# Patient Record
Sex: Male | Born: 1985 | Hispanic: Yes | Smoking: Never smoker
Health system: Southern US, Community
[De-identification: ages and names within clinical notes are randomized; demographics above are authoritative.]

---
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
## 2017-04-03 ENCOUNTER — Emergency Department
Admission: EM | Admit: 2017-04-03 | Discharge: 2017-04-03 | Disposition: A | Discharge status: Home / Self Care | Payer: Self-pay | Attending: Student in an Organized Health Care Education/Training Program | Admitting: Student in an Organized Health Care Education/Training Program

## 2017-04-03 ENCOUNTER — Emergency Department: Payer: Self-pay

## 2017-04-03 ENCOUNTER — Encounter: Payer: Self-pay | Admitting: *Deleted

## 2017-04-03 ENCOUNTER — Other Ambulatory Visit: Payer: Self-pay

## 2017-04-03 DIAGNOSIS — N50812 Left testicular pain: Secondary | ICD-10-CM | POA: Insufficient documentation

## 2017-04-03 DIAGNOSIS — F17210 Nicotine dependence, cigarettes, uncomplicated: Secondary | ICD-10-CM | POA: Insufficient documentation

## 2017-04-03 LAB — URINALYSIS, COMPLETE (UACMP) WITH MICROSCOPIC
BILIRUBIN URINE: NEGATIVE
Bacteria, UA: NONE SEEN
GLUCOSE, UA: NEGATIVE mg/dL
HGB URINE DIPSTICK: NEGATIVE
Ketones, ur: NEGATIVE mg/dL
LEUKOCYTES UA: NEGATIVE
NITRITE: NEGATIVE
PROTEIN: NEGATIVE mg/dL
Specific Gravity, Urine: 1.017 (ref 1.005–1.030)
Squamous Epithelial / LPF: NONE SEEN
pH: 8 (ref 5.0–8.0)

## 2017-04-03 LAB — CBC WITH DIFFERENTIAL/PLATELET
BASOS ABS: 0.1 10*3/uL (ref 0–0.1)
Basophils Relative: 1 %
Eosinophils Absolute: 0.1 10*3/uL (ref 0–0.7)
Eosinophils Relative: 1 %
HEMATOCRIT: 39.9 % — AB (ref 40.0–52.0)
HEMOGLOBIN: 13.5 g/dL (ref 13.0–18.0)
LYMPHS PCT: 26 %
Lymphs Abs: 3.3 10*3/uL (ref 1.0–3.6)
MCH: 29.9 pg (ref 26.0–34.0)
MCHC: 33.9 g/dL (ref 32.0–36.0)
MCV: 88.3 fL (ref 80.0–100.0)
MONO ABS: 0.9 10*3/uL (ref 0.2–1.0)
Monocytes Relative: 7 %
NEUTROS ABS: 8.1 10*3/uL — AB (ref 1.4–6.5)
NEUTROS PCT: 65 %
Platelets: 213 10*3/uL (ref 150–440)
RBC: 4.52 MIL/uL (ref 4.40–5.90)
RDW: 13.7 % (ref 11.5–14.5)
WBC: 12.5 10*3/uL — ABNORMAL HIGH (ref 3.8–10.6)

## 2017-04-03 LAB — BASIC METABOLIC PANEL
ANION GAP: 8 (ref 5–15)
BUN: 15 mg/dL (ref 6–20)
CHLORIDE: 101 mmol/L (ref 101–111)
CO2: 26 mmol/L (ref 22–32)
Calcium: 9.1 mg/dL (ref 8.9–10.3)
Creatinine, Ser: 1.09 mg/dL (ref 0.61–1.24)
GFR calc non Af Amer: >60 mL/min (ref >60)
GLUCOSE: 97 mg/dL (ref 65–99)
POTASSIUM: 3.8 mmol/L (ref 3.5–5.1)
Sodium: 135 mmol/L (ref 135–145)

## 2017-04-03 MED ORDER — MORPHINE SULFATE (PF) 4 MG/ML IV SOLN
4.0000 mg | INTRAVENOUS | Status: DC | PRN
  Administered 2017-04-03: 4 mg via INTRAVENOUS
  Filled 2017-04-03 (×2): qty 1

## 2017-04-03 MED ORDER — TRAMADOL HCL 50 MG PO TABS: 50.0000 mg | ORAL_TABLET | Freq: Four times a day (QID) | ORAL | 0 refills | Status: DC | PRN

## 2017-04-03 MED ORDER — CEFTRIAXONE SODIUM 250 MG IJ SOLR
INTRAMUSCULAR | Status: AC
Start: 2017-04-03 — End: 2017-04-03
  Administered 2017-04-03: 250 mg via INTRAMUSCULAR
  Filled 2017-04-03: qty 250

## 2017-04-03 MED ORDER — AZITHROMYCIN 500 MG PO TABS
1000.0000 mg | ORAL_TABLET | Freq: Once | ORAL | Status: AC
  Administered 2017-04-03: 1000 mg via ORAL

## 2017-04-03 MED ORDER — PROMETHAZINE HCL 25 MG/ML IJ SOLN
12.5000 mg | Freq: Four times a day (QID) | INTRAMUSCULAR | Status: DC | PRN
  Filled 2017-04-03: qty 1

## 2017-04-03 MED ORDER — NAPROXEN 500 MG PO TABS: 500.0000 mg | ORAL_TABLET | Freq: Two times a day (BID) | ORAL | 0 refills | Status: DC

## 2017-04-03 MED ORDER — KETOROLAC TROMETHAMINE 30 MG/ML IJ SOLN
INTRAMUSCULAR | Status: AC
  Filled 2017-04-03: qty 1

## 2017-04-03 MED ORDER — MORPHINE SULFATE (PF) 4 MG/ML IV SOLN
6.0000 mg | INTRAVENOUS | Status: DC | PRN
Start: 2017-04-03 — End: 2017-04-04
  Administered 2017-04-03: 6 mg via INTRAMUSCULAR

## 2017-04-03 MED ORDER — MORPHINE SULFATE (PF) 4 MG/ML IV SOLN
INTRAVENOUS | Status: AC
  Administered 2017-04-03: 6 mg via INTRAMUSCULAR
  Filled 2017-04-03: qty 2

## 2017-04-03 MED ORDER — DEXTROSE 5 % IV SOLN: 250.0000 mg | Freq: Once | INTRAVENOUS | Status: DC

## 2017-04-03 MED ORDER — AZITHROMYCIN 500 MG PO TABS
ORAL_TABLET | ORAL | Status: AC
Start: 2017-04-03 — End: 2017-04-03
  Administered 2017-04-03: 1000 mg via ORAL
  Filled 2017-04-03: qty 2

## 2017-04-03 MED ORDER — CEFTRIAXONE SODIUM 250 MG IJ SOLR
250.0000 mg | Freq: Once | INTRAMUSCULAR | Status: AC
  Administered 2017-04-03: 250 mg via INTRAMUSCULAR

## 2017-04-03 MED ORDER — KETOROLAC TROMETHAMINE 30 MG/ML IJ SOLN
15.0000 mg | Freq: Once | INTRAMUSCULAR | Status: AC
  Administered 2017-04-03: 15 mg via INTRAVENOUS

## 2017-04-03 MED ORDER — DOXYCYCLINE HYCLATE 50 MG PO CAPS: 100.0000 mg | ORAL_CAPSULE | Freq: Two times a day (BID) | ORAL | 0 refills | Status: AC

## 2017-04-03 NOTE — ED Provider Notes (Signed)
[facility] Emergency Department Provider Note    First MD Initiated Contact with Patient 04/03/17 1710     (approximate)  I have reviewed the triage vital signs and the nursing notes.   HISTORY  Chief Complaint Testicle Pain    HPI Luis Bryant is a 31 y.o. male presents with less than 24 hours of progressively worsening severe aching left testicular pain feels also noted some swelling in the left testicle.  Pain is also shooting down into his left groin.  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  Denies any fevers.  No flank pain.  Is never had pain like this before.  Denies any dysuria.  No history of STI.  History reviewed. No pertinent past medical history. History reviewed. No pertinent family history. History reviewed. No pertinent surgical history. There are no active problems to display for this patient.     Prior to Admission medications   Not on File    Allergies Patient has no allergy information on record.    Social History Social History   Tobacco Use  . Smoking status: Current Every Day Smoker    Packs/day: 1.00    Types: Cigarettes  . Smokeless tobacco: Never Used  Substance Use Topics  . Alcohol use: Yes    Frequency: Never    Comment: occasionally.   . Drug use: No    Review of Systems Patient denies headaches, rhinorrhea, blurry vision, numbness, shortness of breath, chest pain, edema, cough, abdominal pain, nausea, vomiting, diarrhea, dysuria, fevers, rashes or hallucinations unless otherwise stated above in HPI. ____________________________________________   PHYSICAL EXAM:  VITAL SIGNS: Vitals:   04/03/17 1650  BP: (!) 149/75  Pulse: 82  Resp: 16  Temp: 97.6 F (36.4 C)  SpO2: 100%    Constitutional: Alert and oriented.  Very uncomfortable appearing but in no acute respiratory distress  eyes:  Conjunctivae are normal.  Head: Atraumatic. Nose: No congestion/rhinnorhea. Mouth/Throat: Mucous membranes are moist.   Neck: No stridor. Painless ROM.  Cardiovascular: Normal rate, regular rhythm. Grossly normal heart sounds.  Good peripheral circulation. Respiratory: Normal respiratory effort.  No retractions. Lungs CTAB. Gastrointestinal: Soft and nontender. No distention. No abdominal bruits. No CVA tenderness. Genitourinary: Normal external genitalia with her left testicle is tender and with no masses.  Cremasteric reflexes present bilaterally Musculoskeletal: No lower extremity tenderness nor edema.  No joint effusions. Neurologic:  Normal speech and language. No gross focal neurologic deficits are appreciated. No facial droop Skin:  Skin is warm, dry and intact. No rash noted. Psychiatric: Mood and affect are normal. Speech and behavior are normal.  ____________________________________________   LABS (all labs ordered are listed, but only abnormal results are displayed)  No results found for this or any previous visit (from the past 24 hour(s)). ____________________________________________ ____________________________________________  RADIOLOGY  I personally reviewed all radiographic images ordered to evaluate for the above acute complaints and reviewed radiology reports and findings.  These findings were personally discussed with the patient.  Please see medical record for radiology report.  ____________________________________________   PROCEDURES  Procedure(s) performed:  Procedures    Critical Care performed: no ____________________________________________   INITIAL IMPRESSION / ASSESSMENT AND PLAN / ED COURSE  Pertinent labs & imaging results that were available during my care of the patient were reviewed by me and considered in my medical decision making (see chart for details).  DDX: Torsion,  orchitis, UTI, hernia, colitis, stone, epididymitis, urinary  tract infection, muscular skeletal strain  Luis Bryant is a 31 y.o. who presents to the ED with symptoms as described above.  Initial presentation was concerning for torsion therefore emergent ultrasound ordered.  Ultrasound showed good bilateral blood flow and his exam was showed bilateral cremasteric reflex with pain isolated more prominently over the left epididymis but the patient was in significant discomfort therefore given IV pain medications.  No clinical evidence of hernia.  His abdominal exam is other soft and benign.  Based on the severity of his pain with a normal ultrasound and CT imaging of his abdomen and pelvis was ordered to evaluate for renal stone.  No evidence of stone or hydronephrosis.  Is not clinically consistent with appendicitis.  Urinalysis is otherwise normal.  Based on the patient's persistent pain and location of the pain will treat with antibiotics given his mild white count due to concern for epididymitis.  Patient denies any dysuria and clinically seems less consistent with UTI but as he is at higher risk given his age will cover with abx.  Patient was able to tolerate PO and was able to ambulate with a steady gait.  Have discussed with the patient and available family all diagnostics and treatments performed thus far and all questions were answered to the best of my ability. The patient demonstrates understanding and agreement with plan.       ____________________________________________   FINAL CLINICAL IMPRESSION(S) / ED DIAGNOSES  Final diagnoses:  Testicular pain, left      NEW MEDICATIONS STARTED DURING THIS VISIT:  This SmartLink is deprecated. Use AVSMEDLIST instead to display the medication list for a patient.   Note:  This document was prepared using Dragon voice recognition software and may include unintentional dictation errors.    Willy Eddyobinson, Jonavan Vanhorn, MD 04/03/17 2209

## 2017-04-03 NOTE — ED Notes (Signed)
Pt refuses any more pain medication, states, "I just dont like to take a lot of medication, I will let you know if I need anything."  EDP aware.

## 2017-04-03 NOTE — ED Notes (Signed)
MD in to speak with patient regarding discharge. Patient is crying in pain from the IM rocephin shot stating that it is "very painful."  Verbal order for morphine before patient can be discharged.

## 2017-04-03 NOTE — ED Notes (Signed)
Patient is refusing pain medication at this time

## 2017-04-03 NOTE — ED Triage Notes (Signed)
Pt to ED reporting new onset left testicular pain that started as an ache last night and has become severe pain today. No changes color reported but pt reports noticing swelling to the left testicle. Pt reports pain is radiating down left leg.

## 2017-04-03 NOTE — Discharge Instructions (Signed)

## 2017-04-03 NOTE — ED Notes (Signed)
Patient states pain is now better and he is able to go home. Discharge instructions reviewed as well as prescriptions. He verbalizes understanding.

## 2017-04-03 NOTE — ED Notes (Signed)
Patient transported to CT 

## 2017-04-05 ENCOUNTER — Other Ambulatory Visit: Payer: Self-pay

## 2017-04-05 ENCOUNTER — Emergency Department: Payer: Self-pay

## 2017-04-05 ENCOUNTER — Encounter: Payer: Self-pay | Admitting: Emergency Medicine

## 2017-04-05 ENCOUNTER — Emergency Department
Admission: EM | Admit: 2017-04-05 | Discharge: 2017-04-05 | Disposition: A | Discharge status: Home / Self Care | Payer: Self-pay | Attending: Emergency Medicine | Admitting: Emergency Medicine

## 2017-04-05 DIAGNOSIS — N451 Epididymitis: Secondary | ICD-10-CM | POA: Insufficient documentation

## 2017-04-05 DIAGNOSIS — F17210 Nicotine dependence, cigarettes, uncomplicated: Secondary | ICD-10-CM | POA: Insufficient documentation

## 2017-04-05 DIAGNOSIS — N50812 Left testicular pain: Secondary | ICD-10-CM | POA: Insufficient documentation

## 2017-04-05 DIAGNOSIS — N50819 Testicular pain, unspecified: Secondary | ICD-10-CM

## 2017-04-05 LAB — CBC WITH DIFFERENTIAL/PLATELET
BASOS ABS: 0.1 10*3/uL (ref 0–0.1)
BASOS PCT: 1 %
EOS ABS: 0.3 10*3/uL (ref 0–0.7)
Eosinophils Relative: 4 %
HCT: 43.1 % (ref 40.0–52.0)
Hemoglobin: 14.5 g/dL (ref 13.0–18.0)
LYMPHS ABS: 2.7 10*3/uL (ref 1.0–3.6)
Lymphocytes Relative: 33 %
MCH: 30.2 pg (ref 26.0–34.0)
MCHC: 33.7 g/dL (ref 32.0–36.0)
MCV: 89.7 fL (ref 80.0–100.0)
MONOS PCT: 9 %
Monocytes Absolute: 0.7 10*3/uL (ref 0.2–1.0)
NEUTROS ABS: 4.3 10*3/uL (ref 1.4–6.5)
NEUTROS PCT: 53 %
PLATELETS: 238 10*3/uL (ref 150–440)
RBC: 4.81 MIL/uL (ref 4.40–5.90)
RDW: 13.8 % (ref 11.5–14.5)
WBC: 8.2 10*3/uL (ref 3.8–10.6)

## 2017-04-05 LAB — URINALYSIS, COMPLETE (UACMP) WITH MICROSCOPIC
BILIRUBIN URINE: NEGATIVE
Bacteria, UA: NONE SEEN
GLUCOSE, UA: NEGATIVE mg/dL
KETONES UR: NEGATIVE mg/dL
LEUKOCYTES UA: NEGATIVE
NITRITE: NEGATIVE
PH: 5 (ref 5.0–8.0)
Protein, ur: NEGATIVE mg/dL
SPECIFIC GRAVITY, URINE: 1.023 (ref 1.005–1.030)
SQUAMOUS EPITHELIAL / LPF: NONE SEEN

## 2017-04-05 LAB — BASIC METABOLIC PANEL
ANION GAP: 12 (ref 5–15)
BUN: 22 mg/dL — ABNORMAL HIGH (ref 6–20)
CALCIUM: 9.4 mg/dL (ref 8.9–10.3)
CO2: 23 mmol/L (ref 22–32)
Chloride: 105 mmol/L (ref 101–111)
Creatinine, Ser: 1.03 mg/dL (ref 0.61–1.24)
Glucose, Bld: 113 mg/dL — ABNORMAL HIGH (ref 65–99)
POTASSIUM: 4.4 mmol/L (ref 3.5–5.1)
SODIUM: 140 mmol/L (ref 135–145)

## 2017-04-05 MED ORDER — TRAMADOL HCL 50 MG PO TABS
50.0000 mg | ORAL_TABLET | Freq: Once | ORAL | Status: AC
  Administered 2017-04-05: 50 mg via ORAL
  Filled 2017-04-05: qty 1

## 2017-04-05 MED ORDER — TRAMADOL HCL 50 MG PO TABS: 50.0000 mg | ORAL_TABLET | Freq: Four times a day (QID) | ORAL | 0 refills | Status: DC | PRN

## 2017-04-05 NOTE — Discharge Instructions (Signed)
Please seek medical attention for any high fevers, chest pain, shortness of breath, change in behavior, persistent vomiting, bloody stool or any other new or concerning symptoms.  

## 2017-04-05 NOTE — ED Provider Notes (Signed)
[facility] Emergency Department Provider Note   ____________________________________________   I have reviewed the triage vital signs and the nursing notes.   HISTORY  Chief Complaint Testicle Pain   History limited by: Not Limited   HPI Luis Bryant is a 31 y.o. male who presents to the emergency department today with continued left testicular pain.   LOCATION:left testicle DURATION:3 days TIMING: constant SEVERITY: severe QUALITY: aching CONTEXT: patient was seen in the emergency department two days ago. Had negative ct and us. Was treated empirically for epididymitis. States he did not get prescription for tramadol. MODIFYING FACTORS: worse with movement ASSOCIATED SYMPTOMS: no fever  Per medical record review patient has a history of recent er visit. Per  drug database no tramadol prescriptions filled.  History reviewed. No pertinent past medical history.  There are no active problems to display for this patient.   History reviewed. No pertinent surgical history.  Prior to Admission medications   Medication Sig Start Date End Date Taking? Authorizing Provider  doxycycline (VIBRAMYCIN) 50 MG capsule Take 2 capsules (100 mg total) by mouth 2 (two) times daily for 7 days. 04/03/17 04/10/17  Willy Eddyobinson, Patrick, MD  naproxen (NAPROSYN) 500 MG tablet Take 1 tablet (500 mg total) by mouth 2 (two) times daily with a meal. 04/03/17 04/03/18  Willy Eddyobinson, Patrick, MD  traMADol (ULTRAM) 50 MG tablet Take 1 tablet (50 mg total) by mouth every 6 (six) hours as needed. 04/03/17 04/03/18  Willy Eddyobinson, Patrick, MD    Allergies Patient has no allergy information on record.  History reviewed. No pertinent family history.  Social History Social History   Tobacco Use  . Smoking status: Current Every Day Smoker    Packs/day: 1.00    Types: Cigarettes  . Smokeless tobacco: Never Used  Substance Use Topics  . Alcohol use: Yes    Frequency: Never   Comment: occasionally.   . Drug use: No    Review of Systems Constitutional: No fever/chills Eyes: No visual changes. ENT: No sore throat. Cardiovascular: Denies chest pain. Respiratory: Denies shortness of breath. Gastrointestinal: No abdominal pain.  No nausea, no vomiting.  No diarrhea.   Genitourinary: Positive for testicular pain. Musculoskeletal: Negative for back pain. Skin: Negative for rash. Neurological: Negative for headaches, focal weakness or numbness.  ____________________________________________   PHYSICAL EXAM:  VITAL SIGNS: ED Triage Vitals  Enc Vitals Group     BP 04/05/17 1847 124/73     Pulse Rate 04/05/17 1847 96     Resp 04/05/17 1847 17     Temp 04/05/17 1847 98.3 F (36.8 C)     Temp Source 04/05/17 1847 Oral     SpO2 04/05/17 1847 98 %     Weight 04/05/17 1847 160 lb (72.6 kg)     Height 04/05/17 1847 5\' 8"  (1.727 m)     Head Circumference --      Peak Flow --      Pain Score 04/05/17 1846 9   Constitutional: Alert and oriented. Well appearing and in no distress. Eyes: Conjunctivae are normal.  ENT   Head: Normocephalic and atraumatic.   Nose: No congestion/rhinnorhea.   Mouth/Throat: Mucous membranes are moist.   Neck: No stridor. Hematological/Lymphatic/Immunilogical: No cervical lymphadenopathy. Cardiovascular: Normal rate, regular rhythm.  No murmurs, rubs, or gallops.  Respiratory: Normal respiratory effort without tachypnea nor retractions. Breath sounds are clear and equal bilaterally. No wheezes/rales/rhonchi. Gastrointestinal: Soft and non tender. No rebound. No guarding.  Genitourinary: Left testicle swollen, tender.  Musculoskeletal: Normal range of motion in all extremities. No lower extremity edema. Neurologic:  Normal speech and language. No gross focal neurologic deficits are appreciated.  Skin:  Skin is warm, dry and intact. No rash noted. Psychiatric: Mood and affect are normal. Speech and behavior are normal.  Patient exhibits appropriate insight and judgment.  ____________________________________________    LABS (pertinent positives/negatives)  Cbc wnl BMP glu 113, bun 22 UA not consistent with infection  ____________________________________________   EKG  None  ____________________________________________    RADIOLOGY  US Left epididymitis. Likely complex epididymal cyst.  ____________________________________________   PROCEDURES  Procedures  ____________________________________________   INITIAL IMPRESSION / ASSESSMENT AND PLAN / ED COURSE  Pertinent labs & imaging results that were available during my care of the patient were reviewed by me and considered in my medical decision making (see chart for details).  Patient presents to the emergency department today because of concerns for continued left testicular pain.  Was seen 2 days ago with a negative workup.  Ultrasound today does show epididymitis.  Patient states he did not get a prescription for his tramadol.  [facility] I do not see any tramadol prescriptions filled.  He states he has been taking his antibiotics.  Will give patient prescription for tramadol.  Discussed results with patient.  Discussed importance of follow-up with urology.  This point I do not feel that the complex cystic lesion represents an abscess given improving white blood cell count.  However urology follow-up should be able to follow him for this.   ____________________________________________   FINAL CLINICAL IMPRESSION(S) / ED DIAGNOSES  Final diagnoses:  Testicle pain  Epididymitis     Note: This dictation was prepared with Dragon dictation. Any transcriptional errors that result from this process are unintentional     Phineas SemenGoodman, Anuj Summons, MD 04/05/17 2041

## 2017-04-05 NOTE — ED Notes (Signed)
Pt ambulating in room without difficulty; pt calm with no distress, drinking bottle of what appears to be soda; voices understanding of d/c instructions and PO med admin as ordered

## 2017-04-05 NOTE — ED Triage Notes (Addendum)
Pt here for left testicle pain. No fevers. Here 2 days ago for same. Pain not any better.  Has started abx from yesterday.  No change in testicle. Still swollen same as yesterday but not worse.  Ambulatory. VSS. Reports was not given pain medication only abx but showing script for tramadol/naproxen ordered.

## 2017-04-05 NOTE — ED Notes (Signed)
SO returns to desk again upset that no one is going to bring him any pain medication and they cannot get prescription filled in time tonight; again explained to SO that as I stated before I will be bringing him a dose of pain medication to take now

## 2017-04-05 NOTE — ED Notes (Signed)
Pt uprite on stretcher in exam room watching TV with no distress noted, eating rice krispie bar; pt reports seen 2 days ago and rx doxycycline for left testicular pain/swelling; pt c/o persistent symptoms; also reports that he was not given any meds for pain; denies any urinary c/o or other accomp symptoms; pt instructed not to eat or drink until u/s results received and examined by ED provider; pt voices understanding

## 2017-04-05 NOTE — ED Notes (Signed)
SO returns to nurses desk, upset that "no one has brought him any ice water"; explained to SO that I will be bringing in his water, pain medication and d/c instructions together

## 2017-04-05 NOTE — ED Notes (Signed)
SO to nurses station, upset that pt "has not been told anything and hasn't received anything for pain"; clarified with pt if MD had spoken with pt about his results and SO st yes but "no one has told him what is happening"; instructed SO that pt may go ahead and get dressed and I will bring in his d/c instructions with his prescription and pain med

## 2017-04-10 NOTE — Progress Notes (Signed)
04/11/2017 3:38 PM   Luis Bryant 01/10/1986 [id number]  Referring provider: No referring provider defined for this encounter.  No chief complaint on file.   HPI: Patient is a 31 year old Caucasian male who is [facility] ED for epididymitis with his wife, Shanda BumpsJessica.     He states one to two weeks ago he started to have left testicular swelling and pain.   He did not have fevers, chills, nausea or vomiting.  He has no urinary symptoms associated with the pain.  He is not having difficulty with erections or pain.  He is not having penile discharge.  He is not having pain with ejaculations.  He has been out of work for the last few days due to the pain.  He has been given azithromycin, Rocephin and is currently on doxycycline.  His UA was negative.    Scrotal ultrasound performed on 04/03/2017 noted no intratesticular mass or torsion.  CT Renal stone study from the same date noted no renal calculi or obstructive changes are noted.  Changes of prior granulomatous disease.  Scrotal ultrasound performed on 04/05/2017 noted heterogeneous and hypervascular left epididymis, suggesting possible acute epididymitis.  Superimposed 6 mm complex cystic lesion at the left epididymal tail. Finding favored to reflect a superimposed incidental complex epididymal cyst, although a small phlegmon and/or early abscess could also be considered.  Otherwise negative testicular ultrasound. No evidence for torsion.  Reviewed referral notes.                         - ED visit on 04/03/2017 Luis BowRoderick D Lengacher is a 31 y.o. male presents with less than 24 hours of progressively worsening severe aching left testicular pain feels also noted some swelling in the left testicle.  Pain is also shooting down into his left groin.  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  Denies any fevers.  No flank pain.  Is never had  pain like this before.  Denies any dysuria.  No history of STI.                    - ED visit on 04/05/2017 Patient presents to the emergency department today because of concerns for continued left testicular pain.  Was seen 2 days ago with a negative workup.  Ultrasound today does show epididymitis.  Patient states he did not get a prescription for his tramadol.  [facility] I do not see any tramadol prescriptions filled.  He states he has been taking his antibiotics.  Will give patient prescription for tramadol.  Discussed results with patient.  Discussed importance of follow-up with urology.  This point I do not feel that the complex cystic lesion represents an abscess given improving white blood cell count.  However urology follow-up should be able to follow him for this.   PMH: No past medical history on file.  Surgical History: No past surgical history on file.  Home Medications:  Allergies as of 04/11/2017   No Known Allergies     Medication List        Accurate as of 04/11/17  3:38 PM. Always use your most recent med list.          doxycycline 100 MG EC tablet Commonly known as:  DORYX Take 100 mg by mouth 2 (two) times daily.  doxycycline 100 MG capsule Commonly known as:  VIBRAMYCIN Take 1 capsule (100 mg total) by mouth every 12 (twelve) hours.   naproxen 500 MG tablet Commonly known as:  NAPROSYN Take 1 tablet (500 mg total) by mouth 2 (two) times daily with a meal.   traMADol 50 MG tablet Commonly known as:  ULTRAM Take 1 tablet (50 mg total) by mouth every 6 (six) hours as needed.       Allergies: No Known Allergies  Family History: Family History  Problem Relation Age of Onset  . Prostate cancer Neg Hx   . Bladder Cancer Neg Hx   . Kidney cancer Neg Hx     Social History:  reports that he has been smoking cigarettes.  He has been smoking about 1.00 pack per day. he has never used smokeless tobacco. He reports that he drinks  alcohol. He reports that he does not use drugs.  ROS: UROLOGY Frequent Urination?: No Hard to postpone urination?: No Burning/pain with urination?: No Get up at night to urinate?: No Leakage of urine?: No Urine stream starts and stops?: No Trouble starting stream?: No Do you have to strain to urinate?: No Blood in urine?: No Urinary tract infection?: No Sexually transmitted disease?: No Injury to kidneys or bladder?: No Painful intercourse?: No Weak stream?: No Erection problems?: No Penile pain?: No  Gastrointestinal Nausea?: No Vomiting?: No Indigestion/heartburn?: No Diarrhea?: No Constipation?: No  Constitutional Fever: No Night sweats?: No Weight loss?: No Fatigue?: No  Skin Skin rash/lesions?: No Itching?: No  Eyes Blurred vision?: No Double vision?: No  Ears/Nose/Throat Sore throat?: No Sinus problems?: No  Hematologic/Lymphatic Swollen glands?: No Easy bruising?: No  Cardiovascular Leg swelling?: No Chest pain?: No  Respiratory Cough?: No Shortness of breath?: No  Endocrine Excessive thirst?: No  Musculoskeletal Back pain?: No Joint pain?: No  Neurological Headaches?: No Dizziness?: No  Psychologic Depression?: No Anxiety?: No  Physical Exam: BP 120/77 (BP Location: Right Arm, Patient Position: Sitting, Cuff Size: Normal)   Pulse 91   Ht 5\' 8"  (1.727 m)   Wt 155 lb 12.8 oz (70.7 kg)   BMI 23.69 kg/m   Constitutional: Well nourished. Alert and oriented, No acute distress. HEENT: [place] AT, moist mucus membranes. Trachea midline, no masses. Cardiovascular: No clubbing, cyanosis, or edema. Respiratory: Normal respiratory effort, no increased work of breathing. GI: Abdomen is soft, non tender, non distended, no abdominal masses. Liver and spleen not palpable.  No hernias appreciated.  Stool sample for occult testing is not indicated.   GU: No CVA tenderness.  No bladder fullness or masses.  Patient with uncircumcised phallus.   Foreskin easily retracted.  Urethral meatus is patent.  No penile discharge. No penile lesions or rashes. Scrotum without lesions, cysts, rashes and/or edema.  Testicles are located scrotally bilaterally. No masses are appreciated in the testicles. Right epididymis is normal.  Left epididymis is tender and indurated.   Rectal: Deferred.  Skin: No rashes, bruises or suspicious lesions. Lymph: No cervical or inguinal adenopathy. Neurologic: Grossly intact, no focal deficits, moving all 4 extremities. Psychiatric: Normal mood and affect.  Laboratory Data: Lab Results  Component Value Date   WBC 8.2 04/05/2017   HGB 14.5 04/05/2017   HCT 43.1 04/05/2017   MCV 89.7 04/05/2017   PLT 238 04/05/2017    Lab Results  Component Value Date   CREATININE 1.03 04/05/2017    Urinalysis Negative.  See Epic.   I have reviewed the labs.   Pertinent Imaging:  CLINICAL DATA:  Worsening left testicular pain x1 day.  EXAM: SCROTAL ULTRASOUND  DOPPLER ULTRASOUND OF THE TESTICLES  TECHNIQUE: Complete ultrasound examination of the testicles, epididymis, and other scrotal structures was performed. Color and spectral Doppler ultrasound were also utilized to evaluate blood flow to the testicles.  COMPARISON:  None.  FINDINGS: Right testicle  Measurements: 4.3 x 2.3 x 2.6 cm. No mass or microlithiasis visualized.  Left testicle  Measurements: 4.1 x 2.2 x 2.7 cm. No mass or microlithiasis visualized.  Right epididymis:  Normal in size and appearance.  Left epididymis:  Normal in size and appearance.  Hydrocele:  None visualized.  Varicocele:  None visualized.  Pulsed Doppler interrogation of both testes demonstrates normal low resistance arterial and venous waveforms bilaterally.  Imaging of the left inguinal canal was also performed demonstrating no apparent herniation of bowel. Herniation of properitoneal fat is not entirely excluded. The diagnosis of an inguinal  hernia is a clinical diagnosis nonetheless.  IMPRESSION: No intratesticular mass or torsion.   Electronically Signed   By: Tollie Eth M.D.   On: 04/03/2017 17:46  CLINICAL DATA:  Left-sided testicular pain  EXAM: CT ABDOMEN AND PELVIS WITHOUT CONTRAST  TECHNIQUE: Multidetector CT imaging of the abdomen and pelvis was performed following the standard protocol without IV contrast.  COMPARISON:  None.  FINDINGS: Lower chest: No acute abnormality.  Hepatobiliary: No focal liver abnormality is seen. No gallstones, gallbladder wall thickening, or biliary dilatation.  Pancreas: Unremarkable. No pancreatic ductal dilatation or surrounding inflammatory changes.  Spleen: Scattered calcified granulomas are noted consistent prior granulomatous disease.  Adrenals/Urinary Tract: Adrenal glands are unremarkable. Kidneys are normal, without renal calculi, focal lesion, or hydronephrosis. Bladder is decompressed.  Stomach/Bowel: The appendix is not well visualized although no inflammatory changes to suggest appendicitis are noted. No obstructive changes or inflammatory changes are noted.  Vascular/Lymphatic: No significant vascular findings are present. No enlarged abdominal or pelvic lymph nodes.  Reproductive: Prostate is unremarkable.  Other: No abdominal wall hernia or abnormality. No abdominopelvic ascites.  Musculoskeletal: No acute or significant osseous findings.  IMPRESSION: No renal calculi or obstructive changes are noted.  Changes of prior granulomatous disease.   Electronically Signed   By: Alcide Clever M.D.   On: 04/03/2017 18:43  CLINICAL DATA:  Initial evaluation for acute left testicular pain for 2 days.  EXAM: ULTRASOUND OF SCROTUM  TECHNIQUE: Complete ultrasound examination of the testicles, epididymis, and other scrotal structures was performed.  COMPARISON:  Prior ultrasound from 04/03/2017.  FINDINGS: Right  testicle  Measurements: 4.2 x 2.2 x 3.1 cm. No mass or microlithiasis visualized.  Left testicle  Measurements: 4.1 x 2.1 x 2.8 cm. No mass or microlithiasis visualized.  Right epididymis:  Normal in size and appearance.  Left epididymis: Left epididymis somewhat heterogeneous and hypervascular as compared to the right, suggesting possible acute epididymitis. 6 x 6 x 6 mm hypoechoic lesion within the left epididymal tail, slightly more prominent as compared to previous.  Hydrocele:  None visualized.  Varicocele:  None visualized.  IMPRESSION: 1. Heterogeneous and hypervascular left epididymis, suggesting possible acute epididymitis. 2. Superimposed 6 mm complex cystic lesion at the left epididymal tail. Finding favored to reflect a superimposed incidental complex epididymal cyst, although a small phlegmon and/or early abscess could also be considered. 3. Otherwise negative testicular ultrasound. No evidence for torsion.   Electronically Signed   By: Rise Mu M.D.   On: 04/05/2017 20:01 I have independently reviewed the films.    Assessment &  Plan:    1. Left epididymitis  - advised patient to continue with the doxycycline   - UA is negative - will send for culture, GC/chlamydia  - RTC in 2 weeks for recheck    Return in about 2 weeks (around 04/25/2017) for recheck.  These notes generated with voice recognition software. I apologize for typographical errors.  Michiel Cowboy, PA-C  [facility] [address] [place], [place] [address] [phone]

## 2017-04-11 ENCOUNTER — Encounter: Payer: Self-pay | Admitting: Urology

## 2017-04-11 ENCOUNTER — Ambulatory Visit (INDEPENDENT_AMBULATORY_CARE_PROVIDER_SITE_OTHER): Payer: Self-pay | Admitting: Urology

## 2017-04-11 VITALS — BP 120/77 | HR 91 | Ht 68.0 in | Wt 155.8 lb

## 2017-04-11 DIAGNOSIS — N451 Epididymitis: Secondary | ICD-10-CM

## 2017-04-11 LAB — URINALYSIS, COMPLETE
Bilirubin, UA: NEGATIVE
Glucose, UA: NEGATIVE
Ketones, UA: NEGATIVE
Leukocytes, UA: NEGATIVE
NITRITE UA: NEGATIVE
PH UA: 5.5 (ref 5.0–7.5)
RBC, UA: NEGATIVE
Specific Gravity, UA: >1.03 — ABNORMAL HIGH (ref 1.005–1.030)
Urobilinogen, Ur: 0.2 mg/dL (ref 0.2–1.0)

## 2017-04-11 LAB — MICROSCOPIC EXAMINATION: Epithelial Cells (non renal): NONE SEEN /hpf (ref 0–10)

## 2017-04-11 MED ORDER — DOXYCYCLINE HYCLATE 100 MG PO CAPS: 100.0000 mg | ORAL_CAPSULE | Freq: Two times a day (BID) | ORAL | 0 refills | Status: DC

## 2017-04-13 LAB — CHLAMYDIA/GONOCOCCUS/TRICHOMONAS, NAA
CHLAMYDIA BY NAA: NEGATIVE
Gonococcus by NAA: NEGATIVE
Trich vag by NAA: NEGATIVE

## 2017-04-13 LAB — CULTURE, URINE COMPREHENSIVE

## 2017-04-16 ENCOUNTER — Telehealth: Payer: Self-pay

## 2017-04-16 NOTE — Telephone Encounter (Signed)
-----   Message from Harle BattiestShannon A McGowan, PA-C sent at 04/13/2017 10:45 AM EST ----- Please let Verner CholRoderick know that his STI cultures were negative.

## 2017-04-16 NOTE — Telephone Encounter (Signed)
Letter sent.

## 2017-05-02 NOTE — Progress Notes (Signed)
05/03/2017 10:53 AM   Luis Bryant 01/18/1986 [id number]  Referring provider: No referring provider defined for this encounter.  Chief Complaint  Patient presents with  . Epididymitis    HPI: 89 WM with a history of epididymitis who presents today for a 2 week follow up.  Background history Patient is a 32 year old Caucasian male who is referred to us by [facility] for epididymitis with his wife, Shanda BumpsJessica.   He states one to two weeks ago he started to have left testicular swelling and pain.   He did not have fevers, chills, nausea or vomiting.  He has no urinary symptoms associated with the pain.  He is not having difficulty with erections or pain.  He is not having penile discharge.  He is not having pain with ejaculations.  He has been out of work for the last few days due to the pain.  He has been given azithromycin, Rocephin and is currently on doxycycline.  His UA was negative.  Scrotal ultrasound performed on 04/03/2017 noted no intratesticular mass or torsion.  CT Renal stone study from the same date noted no renal calculi or obstructive changes are noted.  Changes of prior granulomatous disease.  Scrotal ultrasound performed on 04/05/2017 noted heterogeneous and hypervascular left epididymis, suggesting possible acute epididymitis.  Superimposed 6 mm complex cystic lesion at the left epididymal tail. Finding favored to reflect a superimposed incidental complex epididymal cyst, although a small phlegmon and/or early abscess could also be considered.  Otherwise negative testicular ultrasound. No evidence for torsion. Reviewed referral notes.                         - ED visit on 04/03/2017 Luis Bryant is a 32 y.o. male presents with less than 24 hours of progressively worsening severe aching left testicular pain feels also noted some swelling in the left testicle.  Pain is also shooting down into his left groin.  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  Denies any fevers.  No flank pain.  Is never had pain like this before.  Denies any dysuria.  No history of STI.                    - ED visit on 04/05/2017 Patient presents to the emergency department today because of concerns for continued left testicular pain.  Was seen 2 days ago with a negative workup.  Ultrasound today does show epididymitis.  Patient states he did not get a prescription for his tramadol.  [facility] drug database I do not see any tramadol prescriptions filled.  He states he has been taking his antibiotics.  Will give patient prescription for tramadol.  Discussed results with patient.  Discussed importance of follow-up with urology.  This point I do not feel that the complex cystic lesion represents an abscess given improving white blood cell count.  However urology follow-up should be able to follow him for this.  At his visit on 04/11/2017, urine was sent for culture and GC/chlamydia which were negative.  Today, he states he has finished his antibiotic.  He is no longer having any scrotal pain or swelling.  He is not having fevers, chills, nausea or vomiting.  He has not had gross hematuria, dysuria or suprapubic pain.    PMH: No past medical history on file.  Surgical History: No past surgical history on file.  Home Medications:  Allergies as of 05/03/2017   No Known Allergies     Medication List    as of 05/03/2017 10:53 AM   You have not been prescribed any medications.     Allergies: No Known Allergies  Family History: Family History  Problem Relation Age of Onset  . Prostate cancer Neg Hx   . Bladder Cancer Neg Hx   . Kidney cancer Neg Hx     Social History:  reports that he has been smoking cigarettes.  He has been smoking about 1.00 pack per day. he has never used smokeless tobacco. He reports that he drinks alcohol. He reports that he does not use  drugs.  ROS: UROLOGY Frequent Urination?: No Hard to postpone urination?: No Burning/pain with urination?: No Get up at night to urinate?: No Leakage of urine?: No Urine stream starts and stops?: No Trouble starting stream?: No Do you have to strain to urinate?: No Blood in urine?: No Urinary tract infection?: No Sexually transmitted disease?: No Injury to kidneys or bladder?: No Painful intercourse?: No Weak stream?: No Erection problems?: No Penile pain?: No  Gastrointestinal Nausea?: No Vomiting?: No Indigestion/heartburn?: No Diarrhea?: No Constipation?: No  Constitutional Fever: No Night sweats?: No Weight loss?: No Fatigue?: No  Skin Skin rash/lesions?: No Itching?: No  Eyes Blurred vision?: No Double vision?: No  Ears/Nose/Throat Sore throat?: No Sinus problems?: No  Hematologic/Lymphatic Swollen glands?: No Easy bruising?: No  Cardiovascular Leg swelling?: No Chest pain?: No  Respiratory Cough?: No Shortness of breath?: No  Endocrine Excessive thirst?: No  Musculoskeletal Back pain?: No Joint pain?: No  Neurological Headaches?: No Dizziness?: No  Psychologic Depression?: No Anxiety?: No  Physical Exam: BP 135/76   Pulse 87   Ht 5\' 8"  (1.727 m)   Wt 158 lb (71.7 kg)   BMI 24.02 kg/m   Constitutional: Well nourished. Alert and oriented, No acute distress. HEENT: [place] AT, moist mucus membranes. Trachea midline, no masses. Cardiovascular: No clubbing, cyanosis, or edema. Respiratory: Normal respiratory effort, no increased work of breathing. GI: Abdomen is soft, non tender, non distended, no abdominal masses. Liver and spleen not palpable.  No hernias appreciated.  Stool sample for occult testing is not indicated.   GU: No CVA tenderness.  No bladder fullness or masses.  Patient with uncircumcised phallus.    Urethral meatus is patent.  No penile discharge. No penile lesions or rashes. Scrotum without lesions, cysts, rashes  and/or edema.  Testicles are located scrotally bilaterally. No masses are appreciated in the testicles. Left and right epididymis are normal. Rectal: Not performed.   Skin: No rashes, bruises or suspicious lesions. Lymph: No cervical or inguinal adenopathy. Neurologic: Grossly intact, no focal deficits, moving all 4 extremities. Psychiatric: Normal mood and affect.   Laboratory Data: Lab Results  Component Value Date   WBC 8.2 04/05/2017   HGB 14.5 04/05/2017   HCT 43.1 04/05/2017   MCV 89.7 04/05/2017   PLT 238 04/05/2017    Lab Results  Component Value Date   CREATININE 1.03 04/05/2017    Results for orders placed or performed in visit on 04/11/17  CULTURE, URINE COMPREHENSIVE  Result Value Ref Range   Urine Culture, Comprehensive Final report    Organism ID, Bacteria Comment   Chlamydia/Gonococcus/Trichomonas, NAA  Result Value Ref Range   Chlamydia by NAA Negative Negative   Gonococcus by NAA Negative Negative   Trich vag by NAA Negative Negative  Microscopic Examination  Result Value Ref Range   WBC, UA 0-5 0 - 5 /hpf   RBC, UA 0-2 0 - 2 /hpf   Epithelial Cells (non renal) None seen 0 - 10 /hpf   Mucus, UA Present (A) Not Estab.   Bacteria, UA Few (A) None seen/Few  Urinalysis, Complete  Result Value Ref Range   Specific Gravity, UA >1.030 (H) 1.005 - 1.030   pH, UA 5.5 5.0 - 7.5   Color, UA Yellow Yellow   Appearance Ur Clear Clear   Leukocytes, UA Negative Negative   Protein, UA Trace (A) Negative/Trace   Glucose, UA Negative Negative   Ketones, UA Negative Negative   RBC, UA Negative Negative   Bilirubin, UA Negative Negative   Urobilinogen, Ur 0.2 0.2 - 1.0 mg/dL   Nitrite, UA Negative Negative   Microscopic Examination See below:     I have reviewed the labs.   Pertinent Imaging: CLINICAL DATA:  Worsening left testicular pain x1 day.  EXAM: SCROTAL ULTRASOUND  DOPPLER ULTRASOUND OF THE TESTICLES  TECHNIQUE: Complete ultrasound  examination of the testicles, epididymis, and other scrotal structures was performed. Color and spectral Doppler ultrasound were also utilized to evaluate blood flow to the testicles.  COMPARISON:  None.  FINDINGS: Right testicle  Measurements: 4.3 x 2.3 x 2.6 cm. No mass or microlithiasis visualized.  Left testicle  Measurements: 4.1 x 2.2 x 2.7 cm. No mass or microlithiasis visualized.  Right epididymis:  Normal in size and appearance.  Left epididymis:  Normal in size and appearance.  Hydrocele:  None visualized.  Varicocele:  None visualized.  Pulsed Doppler interrogation of both testes demonstrates normal low resistance arterial and venous waveforms bilaterally.  Imaging of the left inguinal canal was also performed demonstrating no apparent herniation of bowel. Herniation of properitoneal fat is not entirely excluded. The diagnosis of an inguinal hernia is a clinical diagnosis nonetheless.  IMPRESSION: No intratesticular mass or torsion.   Electronically Signed   By: Tollie Eth M.D.   On: 04/03/2017 17:46  CLINICAL DATA:  Left-sided testicular pain  EXAM: CT ABDOMEN AND PELVIS WITHOUT CONTRAST  TECHNIQUE: Multidetector CT imaging of the abdomen and pelvis was performed following the standard protocol without IV contrast.  COMPARISON:  None.  FINDINGS: Lower chest: No acute abnormality.  Hepatobiliary: No focal liver abnormality is seen. No gallstones, gallbladder wall thickening, or biliary dilatation.  Pancreas: Unremarkable. No pancreatic ductal dilatation or surrounding inflammatory changes.  Spleen: Scattered calcified granulomas are noted consistent prior granulomatous disease.  Adrenals/Urinary Tract: Adrenal glands are unremarkable. Kidneys are normal, without renal calculi, focal lesion, or hydronephrosis. Bladder is decompressed.  Stomach/Bowel: The appendix is not well visualized although no inflammatory  changes to suggest appendicitis are noted. No obstructive changes or inflammatory changes are noted.  Vascular/Lymphatic: No significant vascular findings are present. No enlarged abdominal or pelvic lymph nodes.  Reproductive: Prostate is unremarkable.  Other: No abdominal wall hernia or abnormality. No abdominopelvic ascites.  Musculoskeletal: No acute or significant osseous findings.  IMPRESSION: No renal calculi or obstructive changes are noted.  Changes of prior granulomatous disease.   Electronically Signed   By: Alcide Clever M.D.   On: 04/03/2017 18:43  CLINICAL DATA:  Initial evaluation for acute left testicular pain for 2 days.  EXAM: ULTRASOUND OF SCROTUM  TECHNIQUE: Complete ultrasound examination of the testicles, epididymis, and other scrotal structures was performed.  COMPARISON:  Prior ultrasound from 04/03/2017.  FINDINGS: Right testicle  Measurements: 4.2 x  2.2 x 3.1 cm. No mass or microlithiasis visualized.  Left testicle  Measurements: 4.1 x 2.1 x 2.8 cm. No mass or microlithiasis visualized.  Right epididymis:  Normal in size and appearance.  Left epididymis: Left epididymis somewhat heterogeneous and hypervascular as compared to the right, suggesting possible acute epididymitis. 6 x 6 x 6 mm hypoechoic lesion within the left epididymal tail, slightly more prominent as compared to previous.  Hydrocele:  None visualized.  Varicocele:  None visualized.  IMPRESSION: 1. Heterogeneous and hypervascular left epididymis, suggesting possible acute epididymitis. 2. Superimposed 6 mm complex cystic lesion at the left epididymal tail. Finding favored to reflect a superimposed incidental complex epididymal cyst, although a small phlegmon and/or early abscess could also be considered. 3. Otherwise negative testicular ultrasound. No evidence for torsion.   Electronically Signed   By: Rise Mu M.D.   On:  04/05/2017 20:01 I have independently reviewed the films.    Assessment & Plan:    1. Left epididymitis  - resolved   Return if symptoms worsen or fail to improve.  These notes generated with voice recognition software. I apologize for typographical errors.  Michiel Cowboy, PA-C  [facility] [address] [place], [place] [address] [phone]

## 2017-05-03 ENCOUNTER — Encounter: Payer: Self-pay | Admitting: Urology

## 2017-05-03 ENCOUNTER — Ambulatory Visit (INDEPENDENT_AMBULATORY_CARE_PROVIDER_SITE_OTHER): Payer: Self-pay | Admitting: Urology

## 2017-05-03 VITALS — BP 135/76 | HR 87 | Ht 68.0 in | Wt 158.0 lb

## 2017-05-03 DIAGNOSIS — N451 Epididymitis: Secondary | ICD-10-CM

## 2017-11-21 ENCOUNTER — Emergency Department: Payer: Self-pay

## 2017-11-21 ENCOUNTER — Inpatient Hospital Stay: Payer: Self-pay

## 2017-11-21 ENCOUNTER — Inpatient Hospital Stay
Admission: EM | Admit: 2017-11-21 | Discharge: 2017-11-22 | DRG: 201 | Disposition: A | Discharge status: Home / Self Care | Payer: Self-pay | Attending: Surgery | Admitting: Surgery

## 2017-11-21 ENCOUNTER — Other Ambulatory Visit: Payer: Self-pay

## 2017-11-21 DIAGNOSIS — W362XXA Explosion and rupture of air tank, initial encounter: Secondary | ICD-10-CM

## 2017-11-21 DIAGNOSIS — F17210 Nicotine dependence, cigarettes, uncomplicated: Secondary | ICD-10-CM | POA: Diagnosis present

## 2017-11-21 DIAGNOSIS — J939 Pneumothorax, unspecified: Secondary | ICD-10-CM

## 2017-11-21 DIAGNOSIS — S270XXA Traumatic pneumothorax, initial encounter: Principal | ICD-10-CM | POA: Diagnosis present

## 2017-11-21 LAB — CBC
HCT: 40.9 % (ref 40.0–52.0)
HEMATOCRIT: 39.1 % — AB (ref 40.0–52.0)
HEMOGLOBIN: 14.2 g/dL (ref 13.0–18.0)
HEMOGLOBIN: 13.7 g/dL (ref 13.0–18.0)
MCH: 30.4 pg (ref 26.0–34.0)
MCH: 30.9 pg (ref 26.0–34.0)
MCHC: 34.6 g/dL (ref 32.0–36.0)
MCHC: 35 g/dL (ref 32.0–36.0)
MCV: 87.8 fL (ref 80.0–100.0)
MCV: 88.2 fL (ref 80.0–100.0)
Platelets: 248 10*3/uL (ref 150–440)
Platelets: 246 10*3/uL (ref 150–440)
RBC: 4.66 MIL/uL (ref 4.40–5.90)
RBC: 4.43 MIL/uL (ref 4.40–5.90)
RDW: 13.5 % (ref 11.5–14.5)
RDW: 13.6 % (ref 11.5–14.5)
WBC: 11.3 10*3/uL — ABNORMAL HIGH (ref 3.8–10.6)
WBC: 11.2 10*3/uL — ABNORMAL HIGH (ref 3.8–10.6)

## 2017-11-21 LAB — COMPREHENSIVE METABOLIC PANEL
ALK PHOS: 113 U/L (ref 38–126)
ALT: 74 U/L — AB (ref 0–44)
AST: 99 U/L — ABNORMAL HIGH (ref 15–41)
Albumin: 4.3 g/dL (ref 3.5–5.0)
Anion gap: 8 (ref 5–15)
BUN: 21 mg/dL — ABNORMAL HIGH (ref 6–20)
CALCIUM: 9.6 mg/dL (ref 8.9–10.3)
CO2: 25 mmol/L (ref 22–32)
CREATININE: 1.16 mg/dL (ref 0.61–1.24)
Chloride: 107 mmol/L (ref 98–111)
Glucose, Bld: 117 mg/dL — ABNORMAL HIGH (ref 70–99)
Potassium: 3.3 mmol/L — ABNORMAL LOW (ref 3.5–5.1)
Sodium: 140 mmol/L (ref 135–145)
TOTAL PROTEIN: 7.6 g/dL (ref 6.5–8.1)
Total Bilirubin: 0.7 mg/dL (ref 0.3–1.2)

## 2017-11-21 LAB — BASIC METABOLIC PANEL
Anion gap: 6 (ref 5–15)
BUN: 18 mg/dL (ref 6–20)
CHLORIDE: 107 mmol/L (ref 98–111)
CO2: 28 mmol/L (ref 22–32)
Calcium: 9.2 mg/dL (ref 8.9–10.3)
Creatinine, Ser: 0.95 mg/dL (ref 0.61–1.24)
GFR calc Af Amer: >60 mL/min (ref >60)
GFR calc non Af Amer: >60 mL/min (ref >60)
Glucose, Bld: 99 mg/dL (ref 70–99)
POTASSIUM: 3.9 mmol/L (ref 3.5–5.1)
Sodium: 141 mmol/L (ref 135–145)

## 2017-11-21 LAB — TROPONIN I

## 2017-11-21 MED ORDER — HYDROCODONE-ACETAMINOPHEN 5-325 MG PO TABS
1.0000 | ORAL_TABLET | ORAL | Status: DC | PRN
  Administered 2017-11-21: 1 via ORAL
  Administered 2017-11-22 (×2): 2 via ORAL
  Filled 2017-11-21: qty 2
  Filled 2017-11-21: qty 1
  Filled 2017-11-21 (×2): qty 2

## 2017-11-21 MED ORDER — ONDANSETRON 4 MG PO TBDP: 4.0000 mg | ORAL_TABLET | Freq: Four times a day (QID) | ORAL | Status: DC | PRN

## 2017-11-21 MED ORDER — ENOXAPARIN SODIUM 40 MG/0.4ML ~~LOC~~ SOLN
40.0000 mg | SUBCUTANEOUS | Status: DC
  Administered 2017-11-22: 40 mg via SUBCUTANEOUS
  Filled 2017-11-21: qty 0.4

## 2017-11-21 MED ORDER — LIDOCAINE HCL (PF) 1 % IJ SOLN
INTRAMUSCULAR | Status: AC
  Filled 2017-11-21: qty 5

## 2017-11-21 MED ORDER — MORPHINE SULFATE (PF) 2 MG/ML IV SOLN
2.0000 mg | Freq: Once | INTRAVENOUS | Status: DC
  Filled 2017-11-21: qty 1

## 2017-11-21 MED ORDER — MORPHINE SULFATE (PF) 2 MG/ML IV SOLN
2.0000 mg | Freq: Once | INTRAVENOUS | Status: AC
  Administered 2017-11-21: 2 mg via INTRAVENOUS

## 2017-11-21 MED ORDER — ONDANSETRON HCL 4 MG/2ML IJ SOLN: 4.0000 mg | Freq: Four times a day (QID) | INTRAMUSCULAR | Status: DC | PRN

## 2017-11-21 MED ORDER — MORPHINE SULFATE (PF) 2 MG/ML IV SOLN
2.0000 mg | INTRAVENOUS | Status: DC | PRN
  Administered 2017-11-21 (×2): 2 mg via INTRAVENOUS
  Filled 2017-11-21 (×3): qty 1

## 2017-11-21 MED ORDER — ONDANSETRON HCL 4 MG/2ML IJ SOLN
4.0000 mg | Freq: Once | INTRAMUSCULAR | Status: AC
  Administered 2017-11-21: 4 mg via INTRAVENOUS

## 2017-11-21 MED ORDER — OXYMETAZOLINE HCL 0.05 % NA SOLN
1.0000 | Freq: Two times a day (BID) | NASAL | Status: DC
  Administered 2017-11-21 – 2017-11-22 (×2): 1 via NASAL
  Filled 2017-11-21: qty 15

## 2017-11-21 MED ORDER — ONDANSETRON HCL 4 MG/2ML IJ SOLN
INTRAMUSCULAR | Status: AC
  Filled 2017-11-21: qty 2

## 2017-11-21 MED ORDER — FAMOTIDINE 20 MG PO TABS
20.0000 mg | ORAL_TABLET | Freq: Two times a day (BID) | ORAL | Status: DC
  Administered 2017-11-21 – 2017-11-22 (×2): 20 mg via ORAL
  Filled 2017-11-21 (×2): qty 1

## 2017-11-21 MED ORDER — LIDOCAINE-EPINEPHRINE 2 %-1:100000 IJ SOLN
INTRAMUSCULAR | Status: AC
  Filled 2017-11-21: qty 1

## 2017-11-21 MED ORDER — MORPHINE SULFATE (PF) 2 MG/ML IV SOLN
2.0000 mg | Freq: Once | INTRAVENOUS | Status: AC
Start: 2017-11-21 — End: 2017-11-21
  Administered 2017-11-21: 2 mg via INTRAVENOUS

## 2017-11-21 MED ORDER — IOHEXOL 300 MG/ML  SOLN
75.0000 mL | Freq: Once | INTRAMUSCULAR | Status: AC | PRN
  Administered 2017-11-21: 75 mL via INTRAVENOUS

## 2017-11-21 MED ORDER — BISACODYL 5 MG PO TBEC: 5.0000 mg | DELAYED_RELEASE_TABLET | Freq: Every day | ORAL | Status: DC | PRN

## 2017-11-21 MED ORDER — IBUPROFEN 400 MG PO TABS
600.0000 mg | ORAL_TABLET | Freq: Four times a day (QID) | ORAL | Status: DC | PRN
Start: 2017-11-21 — End: 2017-11-22
  Administered 2017-11-22: 600 mg via ORAL
  Filled 2017-11-21: qty 2

## 2017-11-21 NOTE — ED Notes (Signed)
Dr Tonna BoehringerSakai in to place chest tube. Consent obtained
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
## 2017-11-21 NOTE — ED Notes (Signed)
Pt is super anxious and wants to leave. His son is in another room and he is very worried about him. Dr Manson PasseyBrown walked the patient to see him and then he came back to room to have IV started and labs drawn

## 2017-11-21 NOTE — Progress Notes (Signed)
Patient's advocate verbalized frustration over her perceived issues with communication.  She requested RN to adminster something for patient that is more effective for relief of pain.  At time of conversation patient was resting, with no signs or symptoms of pain nor discomfort. RN reviewed current medication orders and schedule for PRN medications.  RN reiterated previous teaching regarding policy and procedures for medication administration and that patient would have to request medication for pain relief.  RN reassured advocate of patient's level of comfort would assessed and maintained to an acceptable level for the patient.  Advocate became physically upset, started crying and reported giving patient medication to help him relax, not part of current medication ordered.  RN educated advocate of issues and concerns for patient and stressed the importance of not giving patient anything outside of ordered medications.  Advocate verbalized understanding and agreed not to give anything else, but continued to stress the issues with feeling as if the staff was not "hearing" her or patient needs.  Advocate requested to speak with "someone" in charge. RN informed charge nurse and requested Nurse Supervisor to speak with patient and family.

## 2017-11-21 NOTE — Progress Notes (Signed)
Patient anxious this shift as well as significant other. Family requesting information regarding patient' plan of care and wanting to see MD- Dr. Tonna BoehringerSakai called, unable to come back to see patient but stated that patient would be staying another 2-3 days and that the chest tube was in the correct place and would be staying in today, Family updated on plan of care. Patient medicated as patient requested with pain medicine and when awake. Family educated that medication order for IV morphine needs to be requested by patient and should be given when patient is awake to request the medication. Patient complained of chest discomfort after eating this shift; MD called and order received for Pepcid, BID. Patient given first dose. Several attempts made to sooth anxiety of significant other and family. Charge Nurse, Beth aware.
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
## 2017-11-22 ENCOUNTER — Inpatient Hospital Stay: Payer: Self-pay

## 2017-11-22 LAB — HIV ANTIBODY (ROUTINE TESTING W REFLEX): HIV Screen 4th Generation wRfx: NONREACTIVE

## 2017-11-22 MED ORDER — HYDROMORPHONE HCL 1 MG/ML IJ SOLN
0.5000 mg | Freq: Once | INTRAMUSCULAR | Status: AC
  Administered 2017-11-22: 0.5 mg via INTRAVENOUS
  Filled 2017-11-22: qty 0.5

## 2017-11-22 MED ORDER — IBUPROFEN 600 MG PO TABS: 600.0000 mg | ORAL_TABLET | Freq: Four times a day (QID) | ORAL | 0 refills | Status: DC | PRN

## 2017-11-22 NOTE — Progress Notes (Signed)
I answered the unit phone and Tobi Bastosnna, cousin of this patient, was calling and was very angry/upset about the care that Verner CholRoderick was receiving.  She said that she wanted "everyone to stop treating him (the patient) like a fucking [place]."  She said that staff did not know what was going on because they were asking the patient and his wife questions.  I informed her that I would discuss these matters with the nursing director.  I spoke with Calton DachSherea, 2C director and she immediately went to the patient's room.

## 2017-11-22 NOTE — Progress Notes (Signed)
Concerns addressed with patient of concerns with  physician communication. Director of nursing for unit called in and discussed issue with patient. Patient and significant other had requested to be transferred due to concerns with physician communication. Provider notified of concerns and also came back to unit to speak with patient when removing chest tube.
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
## 2017-11-22 NOTE — Discharge Instructions (Signed)
Keep dressing intact and dry for 48hrs, then ok to remove and cover with new dressing until wound completely healed. Ibuprofen and tylenol as needed for pain.  Do not take more than 3000mg  of tylenole over 24hr period.  No need for further followup in office unless persistent shortness of breath, increasing pain, cough, and/or fever.  Go to nearest ED if severe shortness of breath or pain

## 2017-11-22 NOTE — Progress Notes (Signed)
Informed Nurse Supervisor and on call Physician of medication given that was not ordered.

## 2017-11-26 ENCOUNTER — Telehealth: Payer: Self-pay

## 2017-11-26 NOTE — Telephone Encounter (Signed)
Flagged on EMMI report for not reading discharge papers.  Called and spoke with patient.  He mentioned he has not read them, but knows where they are to reference as needed.  Said he's experiencing a little pain, but tolerable.  Wife changed dressing this morning, looked okay with only a little discharge.  Encouraged him to call Dr[facility] Geoffery [facility]ai[facility] should he run into any questions or concerns with his condition.  I thanked him for his time and informed him that he would receive one more automated call checking on him in the next few days.

## 2018-11-20 IMAGING — DX DG CHEST 1V
1 series · 1 of 1 positions shown · non-contrast
Comparison: Study obtained earlier in the day

CLINICAL DATA: Chest pain

EXAM:
CHEST  1 VIEW

[chest ap]
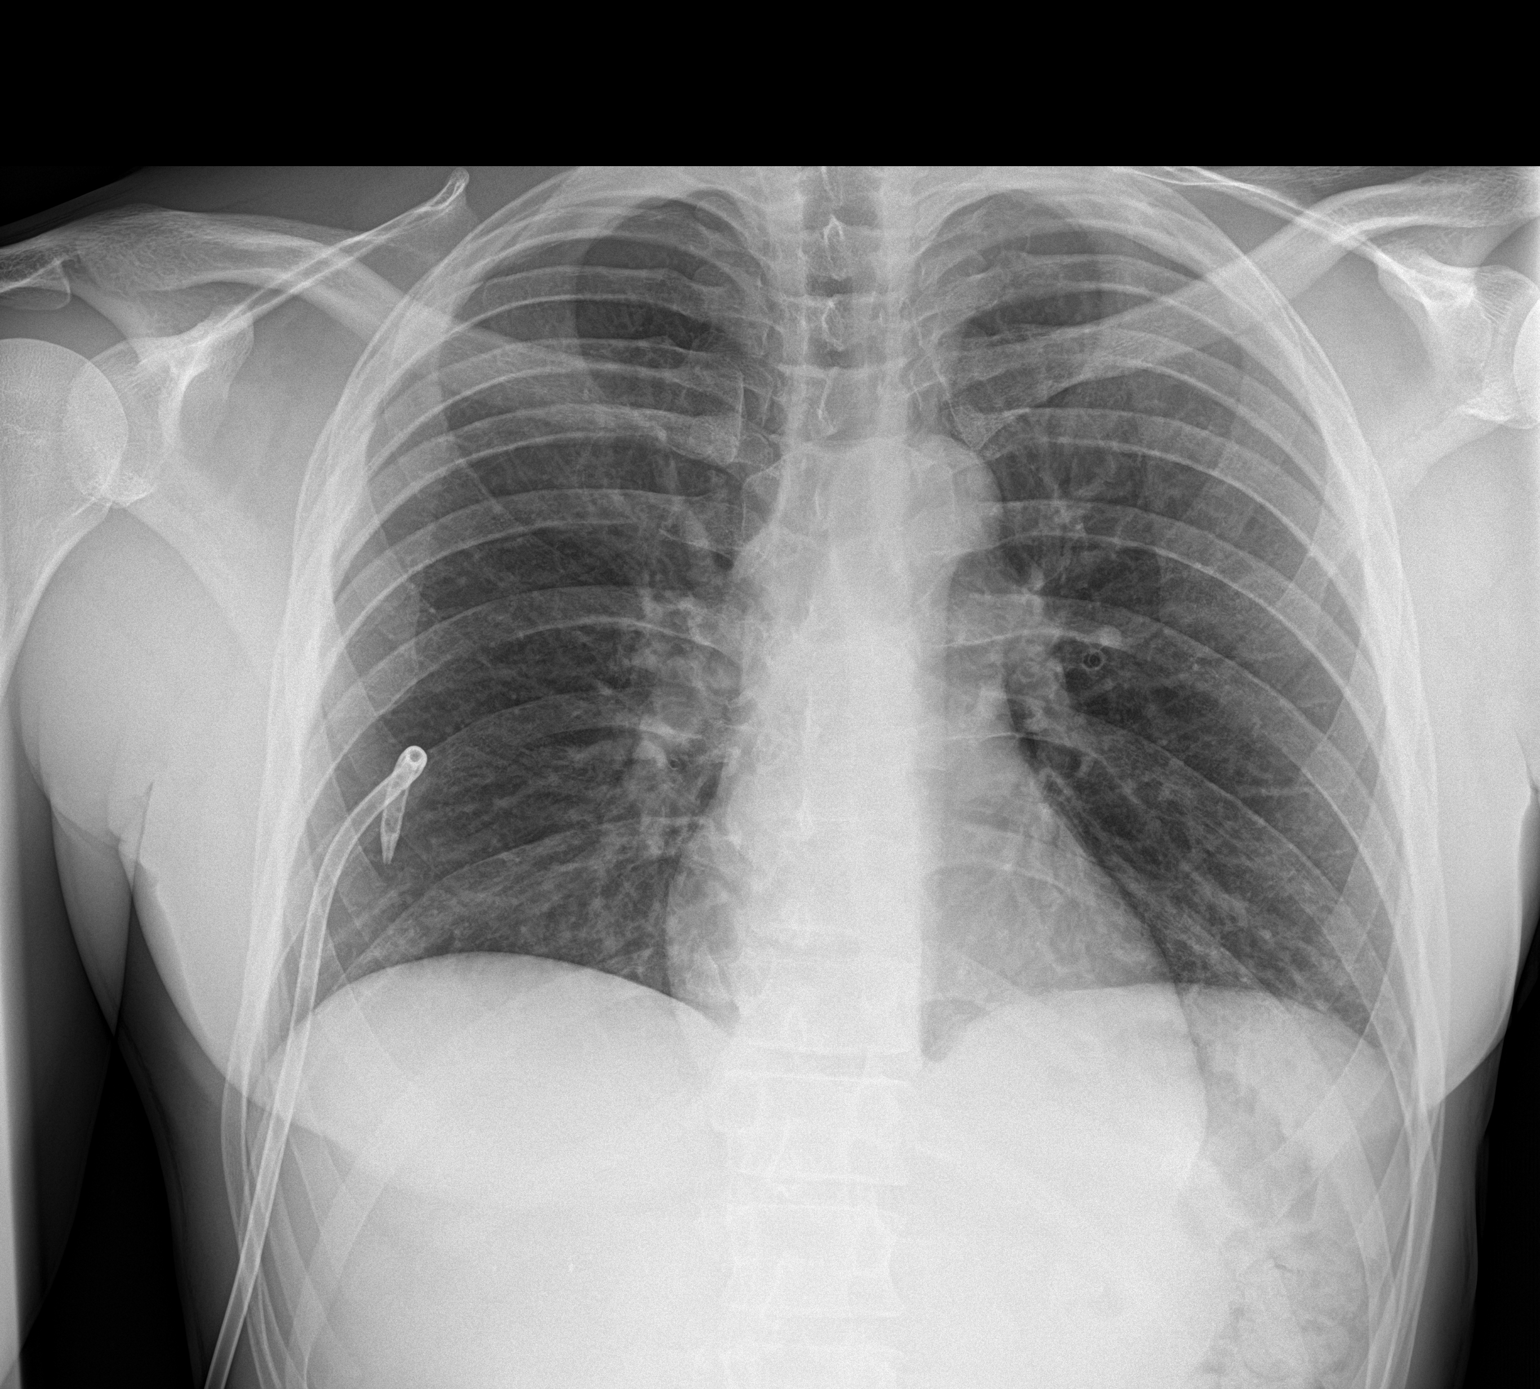

[1 of 1 positions shown; findings below may reference images not displayed]

FINDINGS: Chest tube is present on the right with a slight change in
angulation. There is a small right apical pneumothorax without
tension component. There is no edema or consolidation. Heart size
and pulmonary vascular normal. No adenopathy. No bone lesions.
IMPRESSION: Chest tube remains on the right with slight difference in
positioning. There is a small right apical pneumothorax. No tension
component. No edema or consolidation.

## 2018-11-21 IMAGING — DX DG CHEST 1V
1 series · 1 of 1 positions shown · non-contrast
Comparison: Study obtained earlier in the day

CLINICAL DATA: Chest tube placed to water seal

EXAM:
CHEST  1 VIEW

[chest ap]
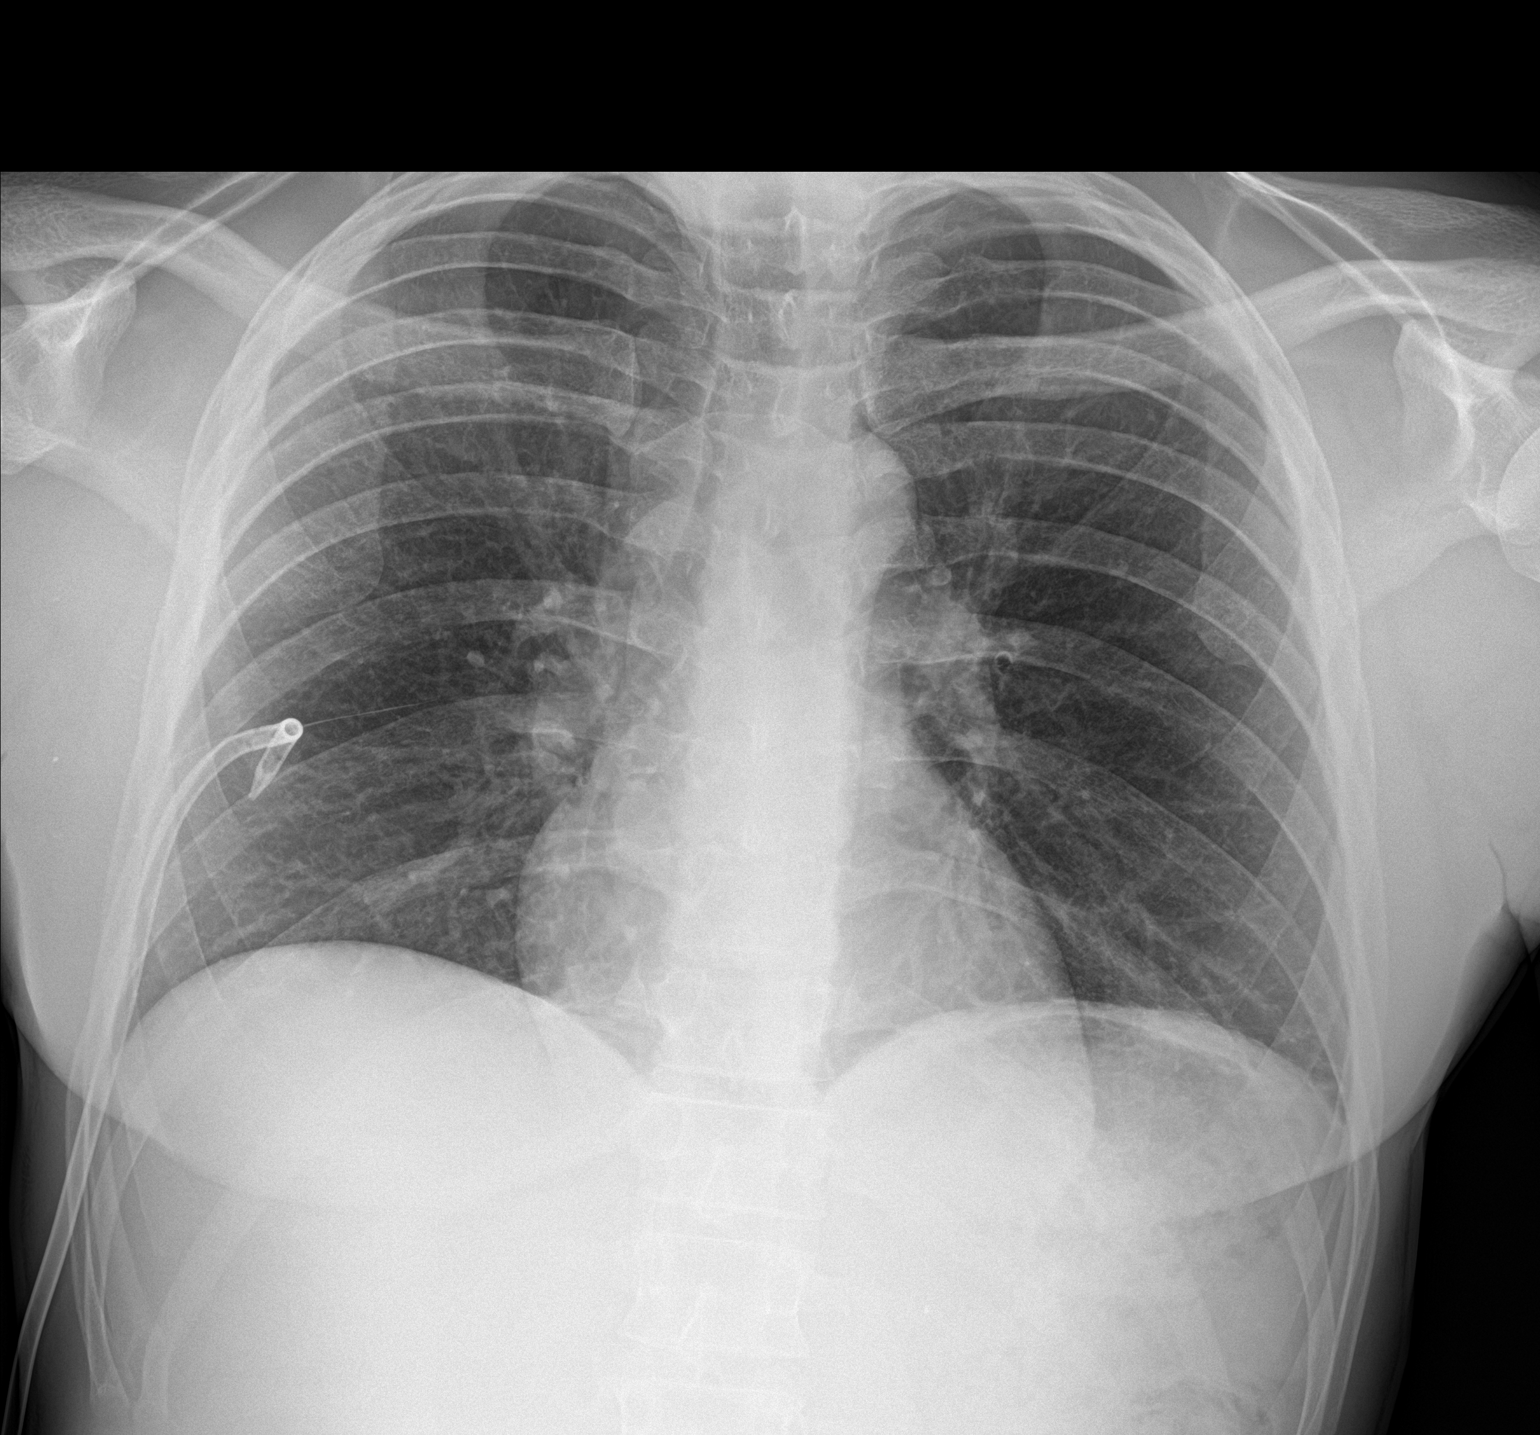

[1 of 1 positions shown; findings below may reference images not displayed]

FINDINGS: Chest tube position is unchanged. No pneumothorax is currently
appreciable. There is no edema or consolidation. There is slight
left base atelectasis. Heart size and pulmonary vascularity are
normal. No adenopathy. No appreciable bone lesions.
IMPRESSION: Stable chest tube position on the right without appreciable
pneumothorax. Mild left base atelectasis. Lungs elsewhere clear.
Stable cardiac silhouette.

## 2020-11-22 ENCOUNTER — Ambulatory Visit
Admission: EM | Admit: 2020-11-22 | Discharge: 2020-11-22 | Disposition: A | Discharge status: Home / Self Care | Payer: Self-pay | Attending: Emergency Medicine | Admitting: Emergency Medicine

## 2020-11-22 ENCOUNTER — Other Ambulatory Visit: Payer: Self-pay

## 2020-11-22 DIAGNOSIS — K0889 Other specified disorders of teeth and supporting structures: Secondary | ICD-10-CM

## 2020-11-22 DIAGNOSIS — N451 Epididymitis: Secondary | ICD-10-CM

## 2020-11-22 DIAGNOSIS — K029 Dental caries, unspecified: Secondary | ICD-10-CM

## 2020-11-22 MED ORDER — HYDROCODONE-ACETAMINOPHEN 5-325 MG PO TABS: 1.0000 | ORAL_TABLET | Freq: Four times a day (QID) | ORAL | 0 refills | Status: DC | PRN

## 2020-11-22 MED ORDER — DOXYCYCLINE HYCLATE 100 MG PO CAPS: 100.0000 mg | ORAL_CAPSULE | Freq: Two times a day (BID) | ORAL | 0 refills | Status: DC

## 2020-11-22 NOTE — Discharge Instructions (Addendum)
Take the doxycycline twice daily with food for 10 days.  This will make you more prone to sunburns make sure wearing sunscreen if you are outdoors.  Use Tylenol and ibuprofen as needed for mild to moderate pain and use the Norco as needed for severe pain.  Do not drink alcohol or drive if you take it and do not operate machinery as this medicine will make you drowsy.  Wear supportive underwear or the flexible order to help take tension off of your epididymis of both testicles to help decrease pain.  You need to follow-up with a dentist for your poor dentition.  There is a Writer guide in your discharge instructions.  Another option would be the [facility] which has a urgent care this on Monday through Friday.  They open at 9 AM and it is first come first serve.

## 2020-11-22 NOTE — ED Triage Notes (Signed)
Pt c/o left sided mouth pain at his wisdom teeth for the last year. Pt also c/o testicle pain off and on for the last couple years.

## 2020-11-22 NOTE — ED Provider Notes (Signed)
[facility]    CSN: [id number] Arrival date & time: 11/22/20  1447      History   Chief Complaint Chief Complaint  Patient presents with   Dental Pain    HPI Luis Bryant is a 35 y.o. male.   HPI  35 year old male here for evaluation of dental and genital complaints.  Patient reports that he has been experiencing off and on pain in his lower jaw back near his wisdom tooth for the past year.  He has been to see several dentists over the years but has not had any corrective action taken against his teeth.  He has a broken lower rear molar that has been broken for the past 4 years.  He states that he has been told he has had gingivitis in the in the past and he is a smoker.  He denies any drainage from around the tooth or fevers.  His second complaint is that he has been having pain in his testicles off and on for the past couple of years.  He reports that he has been to see urology but he does not member what they told him it was just that it was not a hernia.  They told him to wear supportive underwear which she does not do.  He states he does a lot of heavy lifting in his job.  History reviewed. No pertinent past medical history.  There are no problems to display for this patient.   History reviewed. No pertinent surgical history.     Home Medications    Prior to Admission medications   Medication Sig Start Date End Date Taking? Authorizing Provider  doxycycline (VIBRAMYCIN) 100 MG capsule Take 1 capsule (100 mg total) by mouth 2 (two) times daily. 11/22/20   Becky Augusta, NP  HYDROcodone-acetaminophen (NORCO/VICODIN) 5-325 MG tablet Take 1-2 tablets by mouth every 6 (six) hours as needed. 11/22/20   Becky Augusta, NP    Family History History reviewed. No pertinent family history.  Social History Social History   Tobacco Use   Smoking status: Never   Smokeless tobacco: Never  Substance Use Topics   Alcohol use: Yes     Allergies   Patient has no  known allergies.   Review of Systems Review of Systems  Constitutional:  Negative for fever.  HENT:  Positive for dental problem. Negative for mouth sores.   Genitourinary:  Positive for testicular pain. Negative for penile discharge, penile pain, penile swelling and scrotal swelling.  Skin:  Negative for color change.  Neurological:  Negative for headaches.  Hematological: Negative.   Psychiatric/Behavioral: Negative.      Physical Exam Triage Vital Signs ED Triage Vitals  Enc Vitals Group     BP 11/22/20 1543 110/76     Pulse Rate 11/22/20 1543 91     Resp 11/22/20 1543 16     Temp 11/22/20 1543 98.6 F (37 C)     Temp Source 11/22/20 1543 Oral     SpO2 11/22/20 1543 98 %     Weight 11/22/20 1543 130 lb (59 kg)     Height --      Head Circumference --      Peak Flow --      Pain Score 11/22/20 1542 7     Pain Loc --      Pain Edu? --      Excl. in GC? --    No data found.  Updated Vital Signs BP 110/76 (BP Location: Right  Arm)   Pulse 91   Temp 98.6 F (37 C) (Oral)   Resp 16   Wt 130 lb (59 kg)   SpO2 98%   Visual Acuity Right Eye Distance:   Left Eye Distance:   Bilateral Distance:    Right Eye Near:   Left Eye Near:    Bilateral Near:     Physical Exam Vitals and nursing note reviewed.  Constitutional:      General: He is not in acute distress.    Appearance: Normal appearance. He is normal weight. He is not ill-appearing.  HENT:     Head: Normocephalic and atraumatic.     Mouth/Throat:     Mouth: Mucous membranes are dry.     Pharynx: Posterior oropharyngeal erythema present.  Genitourinary:    Penis: Normal.   Skin:    General: Skin is warm and dry.     Capillary Refill: Capillary refill takes less than 2 seconds.     Findings: No erythema or rash.  Neurological:     General: No focal deficit present.     Mental Status: He is alert and oriented to person, place, and time.  Psychiatric:        Mood and Affect: Mood normal.         Behavior: Behavior normal.        Thought Content: Thought content normal.        Judgment: Judgment normal.     UC Treatments / Results  Labs (all labs ordered are listed, but only abnormal results are displayed) Labs Reviewed - No data to display  EKG   Radiology No results found.  Procedures Procedures (including critical care time)  Medications Ordered in UC Medications - No data to display  Initial Impression / Assessment and Plan / UC Course  I have reviewed the triage vital signs and the nursing notes.  Pertinent labs & imaging results that were available during my care of the patient were reviewed by me and considered in my medical decision making (see chart for details).  Patient is a nontoxic appearing 35 year old male here for evaluation of dental and general complaints as outlined in HPI above.  Patient's physical exam reveals dry oral mucous membranes with erythema of the gum tissue.  He has multiple dental fractures and caries on the tooth of his lower jaw the left rear molar is tender to percussion.  There is no drainage from around the tooth noted.  There is possible pulp exposure but it is not new as the tissue looks dried.  Patient's dental exam reveals testicles that are nonedematous and nontender bilaterally.  Patient does have tenderness and beating to the epididymis of both testicles.  No hernia noted on exam and the penile shaft and glans penis are free of lesions.  Patient's exam is consistent with dental caries and epididymitis.  We will treat patient with doxycycline twice daily for 10 days and will give Norco for pain relief.   Final Clinical Impressions(s) / UC Diagnoses   Final diagnoses:  Dental caries  Pain, dental  Epididymitis     Discharge Instructions      Take the doxycycline twice daily with food for 10 days.  This will make you more prone to sunburns make sure wearing sunscreen if you are outdoors.  Use Tylenol and ibuprofen as needed  for mild to moderate pain and use the Norco as needed for severe pain.  Do not drink alcohol or drive if you take it  and do not operate machinery as this medicine will make you drowsy.  Wear supportive underwear or the flexible order to help take tension off of your epididymis of both testicles to help decrease pain.  You need to follow-up with a dentist for your poor dentition.  There is a Writer guide in your discharge instructions.  Another option would be the [facility] which has a urgent care this on Monday through Friday.  They open at 9 AM and it is first come first serve.     ED Prescriptions     Medication Sig Dispense Auth. Provider   doxycycline (VIBRAMYCIN) 100 MG capsule  (Status: Discontinued) Take 1 capsule (100 mg total) by mouth 2 (two) times daily. 20 capsule Becky Augusta, NP   HYDROcodone-acetaminophen (NORCO/VICODIN) 5-325 MG tablet  (Status: Discontinued) Take 1-2 tablets by mouth every 6 (six) hours as needed. 15 tablet Becky Augusta, NP   doxycycline (VIBRAMYCIN) 100 MG capsule Take 1 capsule (100 mg total) by mouth 2 (two) times daily. 20 capsule Becky Augusta, NP   HYDROcodone-acetaminophen (NORCO/VICODIN) 5-325 MG tablet Take 1-2 tablets by mouth every 6 (six) hours as needed. 15 tablet Becky Augusta, NP      I have reviewed the PDMP during this encounter.   Becky Augusta, NP 11/22/20 [phone]

## 2022-01-26 ENCOUNTER — Ambulatory Visit
Admission: EM | Admit: 2022-01-26 | Discharge: 2022-01-26 | Disposition: A | Discharge status: Home / Self Care | Payer: PRIVATE HEALTH INSURANCE | Attending: Emergency Medicine | Admitting: Emergency Medicine

## 2022-01-26 DIAGNOSIS — S39012A Strain of muscle, fascia and tendon of lower back, initial encounter: Secondary | ICD-10-CM | POA: Diagnosis not present

## 2022-01-26 MED ORDER — BACLOFEN 10 MG PO TABS: 10.0000 mg | ORAL_TABLET | Freq: Three times a day (TID) | ORAL | 0 refills | Status: DC

## 2022-01-26 MED ORDER — IBUPROFEN 600 MG PO TABS: 600.0000 mg | ORAL_TABLET | Freq: Four times a day (QID) | ORAL | 0 refills | Status: DC | PRN

## 2022-01-26 NOTE — ED Provider Notes (Signed)
[facility]    CSN: [id number] Arrival date & time: 01/26/22  1627      History   Chief Complaint Chief Complaint  Patient presents with   Back Pain    HPI Luis Bryant is a 36 y.o. male.   HPI  36 year old male here for evaluation of low back pain.  Patient reports that he has been experiencing midline low back pain for the last 2 weeks.  He states that he has done some heavy lifting but he does not member any particular lifting incident or injury that precipitated this pain.  He has had pain in the past.  He states that the pain increases with movement and also if he is lying flat.  He has not taken anything to help with his pain.  He denies any radiation to the legs, saddle anesthesia, or incontinence.  History reviewed. No pertinent past medical history.  There are no problems to display for this patient.   History reviewed. No pertinent surgical history.     Home Medications    Prior to Admission medications   Medication Sig Start Date End Date Taking? Authorizing Provider  baclofen (LIORESAL) 10 MG tablet Take 1 tablet (10 mg total) by mouth 3 (three) times daily. 01/26/22  Yes Becky Augusta, NP  ibuprofen (ADVIL) 600 MG tablet Take 1 tablet (600 mg total) by mouth every 6 (six) hours as needed. 01/26/22  Yes Becky Augusta, NP    Family History Family History  Problem Relation Age of Onset   Prostate cancer Neg Hx    Bladder Cancer Neg Hx    Kidney cancer Neg Hx     Social History Social History   Tobacco Use   Smoking status: Never   Smokeless tobacco: Never  Vaping Use   Vaping Use: Never used  Substance Use Topics   Alcohol use: Yes    Comment: occasionally.    Drug use: No     Allergies   Patient has no known allergies.   Review of Systems Review of Systems  Constitutional:  Negative for fever.  Musculoskeletal:  Positive for back pain.  Neurological:  Negative for weakness and numbness.     Physical Exam Triage  Vital Signs ED Triage Vitals  Enc Vitals Group     BP 01/26/22 1642 106/66     Pulse Rate 01/26/22 1642 98     Resp 01/26/22 1642 18     Temp 01/26/22 1642 98 F (36.7 C)     Temp Source 01/26/22 1642 Oral     SpO2 01/26/22 1642 96 %     Weight --      Height --      Head Circumference --      Peak Flow --      Pain Score 01/26/22 1641 7     Pain Loc --      Pain Edu? --      Excl. in GC? --    No data found.  Updated Vital Signs BP 106/66 (BP Location: Right Arm)   Pulse 98   Temp 98 F (36.7 C) (Oral)   Resp 18   SpO2 96%   Visual Acuity Right Eye Distance:   Left Eye Distance:   Bilateral Distance:    Right Eye Near:   Left Eye Near:    Bilateral Near:     Physical Exam Vitals and nursing note reviewed.  Constitutional:      Appearance: Normal appearance.  Cardiovascular:  Rate and Rhythm: Normal rate.     Pulses: Normal pulses.     Heart sounds: Normal heart sounds. No murmur heard.    No friction rub. No gallop.  Pulmonary:     Effort: Pulmonary effort is normal.     Breath sounds: Normal breath sounds. No wheezing, rhonchi or rales.  Musculoskeletal:        General: Tenderness present. No swelling, deformity or signs of injury.  Skin:    General: Skin is warm and dry.     Capillary Refill: Capillary refill takes less than 2 seconds.  Neurological:     General: No focal deficit present.     Mental Status: He is alert and oriented to person, place, and time.     Sensory: No sensory deficit.     Motor: No weakness.     Deep Tendon Reflexes: Reflexes normal.  Psychiatric:        Mood and Affect: Mood normal.        Behavior: Behavior normal.        Thought Content: Thought content normal.        Judgment: Judgment normal.      UC Treatments / Results  Labs (all labs ordered are listed, but only abnormal results are displayed) Labs Reviewed - No data to display  EKG   Radiology No results found.  Procedures Procedures (including  critical care time)  Medications Ordered in UC Medications - No data to display  Initial Impression / Assessment and Plan / UC Course  I have reviewed the triage vital signs and the nursing notes.  Pertinent labs & imaging results that were available during my care of the patient were reviewed by me and considered in my medical decision making (see chart for details).   Patient is a nontoxic-appearing 36 year old male here for evaluation of 2 weeks worth of midline low back pain that is not associate with any particular precipitating event.  He has not taken anything for the pain.  He states the pain is increased with moving and laying flat.  No numbness, tingling, weakness in his lower extremities.  He denies saddle anesthesia or incontinence.  On exam patient does have lower lumbar bilateral paraspinous tenderness just off of midline.  No midline spinous process tenderness or step-off.  There is a mild spasm present in the muscle tissue as well.  Patient's bilateral lower extremity strength is 5/5 in his bilateral DTRs his lower extremities are 2+.  We will treat the patient for lumbar strain with ibuprofen, baclofen, home physical therapy, and moist heat.  Given the patient a note to be off work tomorrow to let this back relax.  Return precautions reviewed.   Final Clinical Impressions(s) / UC Diagnoses   Final diagnoses:  Strain of lumbar region, initial encounter     Discharge Instructions      Take the ibuprofen, 600 mg every 6 hours with food, on a schedule for the next 48 hours and then as needed.  Take the baclofen, 10 mg every 8 hours, on a schedule for the next 48 hours and then as needed.  Apply moist heat to your back for 30 minutes at a time 2-3 times a day to improve blood flow to the area and help remove the lactic acid causing the spasm.  Follow the back exercises given at discharge.  Return for reevaluation for any new or worsening symptoms.      ED Prescriptions      Medication Sig  Dispense Auth. Provider   ibuprofen (ADVIL) 600 MG tablet Take 1 tablet (600 mg total) by mouth every 6 (six) hours as needed. 30 tablet Margarette Canada, NP   baclofen (LIORESAL) 10 MG tablet Take 1 tablet (10 mg total) by mouth 3 (three) times daily. 65 each Margarette Canada, NP      PDMP not reviewed this encounter.   Margarette Canada, NP 01/26/22 1654

## 2022-01-26 NOTE — Discharge Instructions (Signed)
Take the ibuprofen, 600 mg every 6 hours with food, on a schedule for the next 48 hours and then as needed.  Take the baclofen, 10 mg every 8 hours, on a schedule for the next 48 hours and then as needed.  Apply moist heat to your back for 30 minutes at a time 2-3 times a day to improve blood flow to the area and help remove the lactic acid causing the spasm.  Follow the back exercises given at discharge.  Return for reevaluation for any new or worsening symptoms.  

## 2022-01-26 NOTE — ED Triage Notes (Signed)
Pt present Lower mid-back pain, symptoms started two weeks ago.

## 2022-07-25 ENCOUNTER — Other Ambulatory Visit: Payer: Self-pay

## 2022-07-25 ENCOUNTER — Emergency Department
Admission: EM | Admit: 2022-07-25 | Discharge: 2022-07-25 | Disposition: A | Discharge status: Home / Self Care | Payer: Medicaid Other | Attending: Emergency Medicine | Admitting: Emergency Medicine

## 2022-07-25 DIAGNOSIS — K0889 Other specified disorders of teeth and supporting structures: Secondary | ICD-10-CM | POA: Insufficient documentation

## 2022-07-25 DIAGNOSIS — F17210 Nicotine dependence, cigarettes, uncomplicated: Secondary | ICD-10-CM | POA: Diagnosis not present

## 2022-07-25 LAB — COMPREHENSIVE METABOLIC PANEL
ALT: 20 U/L (ref 0–44)
AST: 22 U/L (ref 15–41)
Albumin: 3.9 g/dL (ref 3.5–5.0)
Alkaline Phosphatase: 110 U/L (ref 38–126)
Anion gap: 8 (ref 5–15)
BUN: 12 mg/dL (ref 6–20)
CO2: 28 mmol/L (ref 22–32)
Calcium: 9.4 mg/dL (ref 8.9–10.3)
Chloride: 100 mmol/L (ref 98–111)
Creatinine, Ser: 1.07 mg/dL (ref 0.61–1.24)
GFR, Estimated: >60 mL/min (ref >60)
Glucose, Bld: 74 mg/dL (ref 70–99)
Potassium: 3.9 mmol/L (ref 3.5–5.1)
Sodium: 136 mmol/L (ref 135–145)
Total Bilirubin: 0.5 mg/dL (ref 0.3–1.2)
Total Protein: 7.8 g/dL (ref 6.5–8.1)

## 2022-07-25 LAB — CBC
HCT: 45.8 % (ref 39.0–52.0)
Hemoglobin: 15.2 g/dL (ref 13.0–17.0)
MCH: 29.4 pg (ref 26.0–34.0)
MCHC: 33.2 g/dL (ref 30.0–36.0)
MCV: 88.6 fL (ref 80.0–100.0)
Platelets: 279 10*3/uL (ref 150–400)
RBC: 5.17 MIL/uL (ref 4.22–5.81)
RDW: 12.6 % (ref 11.5–15.5)
WBC: 8.5 10*3/uL (ref 4.0–10.5)
nRBC: 0 % (ref 0.0–0.2)

## 2022-07-25 MED ORDER — PENICILLIN V POTASSIUM 250 MG PO TABS: 250.0000 mg | ORAL_TABLET | Freq: Four times a day (QID) | ORAL | 0 refills | Status: DC

## 2022-07-25 MED ORDER — TRAMADOL HCL 50 MG PO TABS: 50.0000 mg | ORAL_TABLET | Freq: Four times a day (QID) | ORAL | 0 refills | Status: DC | PRN

## 2022-07-25 NOTE — ED Provider Notes (Signed)
   [facility] Provider Note    Event Date/Time   First MD Initiated Contact with Patient 07/25/22 1500     (approximate)   History   Dental pain HPI  Luis Bryant is a 37 y.o. male who presents with complaints of dental pain.  Patient describes pain in the posterior molars, left upper times about 2 to 3 days.  No fevers, no intraoral swelling.  He does smoke cigarettes.  Patient states that he is occasionally had a small amount of blood in his stool but none today or yesterday.  No abdominal pain     Physical Exam   Triage Vital Signs: ED Triage Vitals [07/25/22 1411]  Enc Vitals Group     BP 129/74     Pulse Rate 91     Resp 18     Temp 97.8 F (36.6 C)     Temp Source Oral     SpO2 100 %     Weight 59 kg (130 lb 1.1 oz)     Height 1.727 m (5\' 8" )     Head Circumference      Peak Flow      Pain Score 8     Pain Loc      Pain Edu?      Excl. in [place]?     Most recent vital signs: Vitals:   07/25/22 1411  BP: 129/74  Pulse: 91  Resp: 18  Temp: 97.8 F (36.6 C)  SpO2: 100%     General: Awake, no distress.  CV:  Good peripheral perfusion.  Resp:  Normal effort.  Abd:  No distention.  Patient refused rectal exam Other:  Patient with significant dental disease diffusely but primarily pain is centered around tooth 15 and 16, no drainable abscess, no intraoral swelling   ED Results / Procedures / Treatments   Labs (all labs ordered are listed, but only abnormal results are displayed) Labs Reviewed  CBC  COMPREHENSIVE METABOLIC PANEL     EKG     RADIOLOGY     PROCEDURES:  Critical Care performed:   Procedures   MEDICATIONS ORDERED IN ED: Medications - No data to display   IMPRESSION / MDM / [place] / ED COURSE  I reviewed the triage vital signs and the nursing notes. Patient's presentation is most consistent with acute, uncomplicated illness.  Patient presents with primary complaint of dental  pain, most consistent with dental infection, will start on penicillin, analgesics, recommend follow-up with dentist in 1 week.  Patient does not want workup for intermittent rectal bleeding, refusing rectal exam, does not want lab work.,  Recommend follow-up with PCP        FINAL CLINICAL IMPRESSION(S) / ED DIAGNOSES   Final diagnoses:  Pain, dental     Rx / DC Orders   ED Discharge Orders          Ordered    penicillin v potassium (VEETID) 250 MG tablet  4 times daily        07/25/22 1507    traMADol (ULTRAM) 50 MG tablet  Every 6 hours PRN        07/25/22 1507             Note:  This document was prepared using Dragon voice recognition software and may include unintentional dictation errors.   Lavonia Drafts, MD 07/25/22 1535

## 2022-07-25 NOTE — ED Triage Notes (Signed)
Pt here with dental pain and rectal bleeding. Pt states pain is in the top and bottom left side of his mouth, pt needs to have his wisdom tooth removed. Pt states when he does have the rectal bleeding it is bright red in color and no clots. Pt denies abd pain.

## 2023-05-15 ENCOUNTER — Ambulatory Visit
Admission: EM | Admit: 2023-05-15 | Discharge: 2023-05-15 | Disposition: A | Discharge status: Home / Self Care | Payer: 59 | Attending: Emergency Medicine | Admitting: Emergency Medicine

## 2023-05-15 DIAGNOSIS — S39012A Strain of muscle, fascia and tendon of lower back, initial encounter: Secondary | ICD-10-CM | POA: Diagnosis not present

## 2023-05-15 MED ORDER — METHYLPREDNISOLONE 4 MG PO TBPK: ORAL_TABLET | Freq: Every day | ORAL | 0 refills | Status: DC

## 2023-05-15 MED ORDER — TIZANIDINE HCL 4 MG PO TABS: 4.0000 mg | ORAL_TABLET | Freq: Three times a day (TID) | ORAL | 0 refills | Status: DC | PRN

## 2023-05-15 MED ORDER — ACETAMINOPHEN 325 MG PO TABS
975.0000 mg | ORAL_TABLET | Freq: Once | ORAL | Status: AC
  Administered 2023-05-15: 975 mg via ORAL

## 2023-05-15 MED ORDER — IBUPROFEN 600 MG PO TABS: 600.0000 mg | ORAL_TABLET | Freq: Four times a day (QID) | ORAL | 0 refills | Status: DC | PRN

## 2023-05-15 MED ORDER — KETOROLAC TROMETHAMINE 30 MG/ML IJ SOLN
30.0000 mg | Freq: Once | INTRAMUSCULAR | Status: AC
  Administered 2023-05-15: 30 mg via INTRAMUSCULAR

## 2023-05-15 NOTE — Discharge Instructions (Addendum)
 600 mg of ibuprofen  combined with 1000 mg of tylenol  3 or 4 times a day.  May take an additional 1000 mg of Tylenol  once or twice a day.  Try the Zanaflex  and make sure you finish the Medrol  Dosepak.  Heat has been shown to be beneficial.  Many people also find gentle stretching and deep tissue massage helpful.  Start this in a few days when the acute phase is over.  Here is a list of primary care providers who are taking new patients:  [facility] [place] Dr. Selinda Ku (sports medicine) Dr. Cathryne Molt [address] [place] [address] [phone]  [facility] at [facility] [address] [place], [place] [address] [phone]  [facility] [place] [address]  [place] [address]  [phone]  [facility] [address] [place], [place] [address] [phone]  [facility] [address] [place]  [phone] [place], [place] [address]  Here are clinics/ other resources who will see you if you do not have insurance. Some have certain criteria that you must meet. Call them and find out what they are:  [facility]: [address], [place], [place] [address] Phone: [phone] Hours: First and Third Saturdays of each Month, 9 a.m. - 1 p.m.  [facility]: [address] [place], [place] [address] Phone: [phone] Hours: Tuesday, 4 p.m. - 8 p.m. Thursday, 1 p.m. - 8 p.m. Wednesday, 9 a.m. - [facility] [address], [place], [place] [address] Phone: [phone] Pharmacy Phone Number: [phone] Dental Phone Number: [phone] [facility] Insurance Help: [phone]  Dental Hours: Monday - Thursday, 8 a.m. - 6 p.m.  [facility] [address], [place], [place] [address] Phone: [phone] Pharmacy Phone Number: [phone] [facility] Insurance Help: [phone]  [facility] [address] [place], [place] [address] Phone:  [phone] Pharmacy Phone Number: [phone] [facility] Insurance Help: [phone]  [facility] [address] [place], [place] [address] Phone: [phone] [facility] Insurance Help: [phone]   [facility] [address], [place], [place] [address] Phone: [phone]  Go to www.goodrx.com  or www.costplusdrugs.com to look up your medications. This will give you a list of where you can find your prescriptions at the most affordable prices. Or ask the pharmacist what the cash price is, or if they have any other discount programs available to help make your medication more affordable. This can be less expensive than what you would pay with insurance.

## 2023-05-15 NOTE — ED Provider Notes (Signed)
 HPI  SUBJECTIVE:  Luis Bryant is a 38 y.o. male who presents with constant, sharp low back pain starting while sitting on the toilet, having a bowel movement at 1730 today. Denies N/V, fevers, flank pain, abdominal pain, urinary urgency, frequency, dysuria, cloudy or odorous urine, hematuria.  No saddle anesthesia, distal weakness/numbness, bilateral radicular leg pain/weakness, recent h/o trauma, neurological deficits,  bladder/ bowel incontinence, urinary retention. He has been doing a lot of heavy lifting recently.  He tried heat without improvement in his symptoms.  Symptoms are worse with large positional changes, torso movement.  States feels similar to previous episodes of back pain.   He also has a past medical history of pneumothorax.  No history of diabetes, hypertension, osteoporosis, cancer, multiple myeloma, aortic abdominal aneurysm.  PCP: None.  History reviewed. No pertinent past medical history.  History reviewed. No pertinent surgical history.  Family History  Problem Relation Age of Onset   Prostate cancer Neg Hx    Bladder Cancer Neg Hx    Kidney cancer Neg Hx     Social History   Tobacco Use   Smoking status: Never   Smokeless tobacco: Never  Vaping Use   Vaping status: Never Used  Substance Use Topics   Alcohol use: Yes    Comment: occasionally.    Drug use: No    No current facility-administered medications for this encounter.  Current Outpatient Medications:    ibuprofen  (ADVIL ) 600 MG tablet, Take 1 tablet (600 mg total) by mouth every 6 (six) hours as needed., Disp: 30 tablet, Rfl: 0   methylPREDNISolone  (MEDROL  DOSEPAK) 4 MG TBPK tablet, Take by mouth daily. Follow package instructions, Disp: 21 tablet, Rfl: 0   tiZANidine  (ZANAFLEX ) 4 MG tablet, Take 1 tablet (4 mg total) by mouth every 8 (eight) hours as needed for muscle spasms., Disp: 30 tablet, Rfl: 0  No Known Allergies   ROS  As noted in HPI.   Physical Exam  BP 114/76 (BP Location:  Left Arm)   Pulse (!) 101   Temp 98.6 F (37 C) (Oral)   Resp 16   Ht 5' 6 (1.676 m)   Wt 77.1 kg   SpO2 97%   BMI 27.44 kg/m   Constitutional: Well developed, well nourished, appears uncomfortable. Eyes:  EOMI, conjunctiva normal bilaterally HENT: Normocephalic, atraumatic,mucus membranes moist Respiratory: Normal inspiratory effort Cardiovascular: Normal rate GI: nondistended.  skin: No rash, skin intact Musculoskeletal: Positive bilateral paralumbar tenderness, worse on the left.  Positive muscle spasm left side.  No L-spine, SI joint, sacral bony tenderness. Bilateral lower extremities nontender, baseline ROM with intact PT pulses.  Sensation between thighs intact. No pain with passive int/ext rotation, flex/extension hips, AD/ABduction bilaterally. SLR neg bilaterally. Sensation baseline light touch bilaterally for Pt, DTR's symmetric and intact bilaterally KJ , Motor symmetric bilateral 5/5 hip flexion, quadriceps, hamstrings, EHL, foot dorsiflexion, foot plantarflexion, gait somewhat antalgic but without apparent new ataxia.  Pain aggravated with all movement. Neurologic: Alert & oriented x 3, no focal neuro deficits Psychiatric: Speech and behavior appropriate   ED Course   Medications  acetaminophen  (TYLENOL ) tablet 975 mg (975 mg Oral Given 05/15/23 1952)  ketorolac  (TORADOL ) 30 MG/ML injection 30 mg (30 mg Intramuscular Given 05/15/23 1952)    No orders of the defined types were placed in this encounter.   No results found for this or any previous visit (from the past 24 hours). No results found.  ED Clinical Impression  1. Strain of lumbar region,  initial encounter     ED Assessment/Plan      Patient presents with an acute lumbar strain.  No evidence of spinal cord involvement based on H&P. Pt describing typical back pain, has been < 6 week duration. No historical red flags as noted in HPI. No physical red flags such as fever, bony tenderness, lower  extremity weakness, saddle anesthesia. Imaging not indicated at this time.   Giving Toradol  30 mg IM x 1 and Tylenol  975 mg p.o.  Will send home with Medrol  Dosepak, ibuprofen /Tylenol , Zanaflex , advised heat, gentle stretching and deep tissue massage starting in several days.  Will provide primary care list for routine care.  Follow-up with PCP or with orthopedics if not better in 4 weeks.  Discussed that this could last for up to 6 weeks.  Work note for 3 days.  Discussed MDM, treatment plan, and plan for follow-up with patient. Discussed sn/sx that should prompt return to the ED. patient agrees with plan.   Meds ordered this encounter  Medications   acetaminophen  (TYLENOL ) tablet 975 mg   ketorolac  (TORADOL ) 30 MG/ML injection 30 mg   ibuprofen  (ADVIL ) 600 MG tablet    Sig: Take 1 tablet (600 mg total) by mouth every 6 (six) hours as needed.    Dispense:  30 tablet    Refill:  0   methylPREDNISolone  (MEDROL  DOSEPAK) 4 MG TBPK tablet    Sig: Take by mouth daily. Follow package instructions    Dispense:  21 tablet    Refill:  0   tiZANidine  (ZANAFLEX ) 4 MG tablet    Sig: Take 1 tablet (4 mg total) by mouth every 8 (eight) hours as needed for muscle spasms.    Dispense:  30 tablet    Refill:  0    *This clinic note was created using Scientist, clinical (histocompatibility and immunogenetics). Therefore, there may be occasional mistakes despite careful proofreading.  ?     Van Knee, MD 05/15/23 1958

## 2023-05-15 NOTE — ED Triage Notes (Signed)
 Pt c/o lower back pain since today. States was sitting on the toilet & felt a sharp pain in back. Denies any falls or injuries.

## 2024-01-17 ENCOUNTER — Ambulatory Visit
Admission: EM | Admit: 2024-01-17 | Discharge: 2024-01-17 | Disposition: A | Discharge status: Home / Self Care | Attending: Physician Assistant | Admitting: Physician Assistant

## 2024-01-17 DIAGNOSIS — G8929 Other chronic pain: Secondary | ICD-10-CM | POA: Diagnosis not present

## 2024-01-17 DIAGNOSIS — S39012A Strain of muscle, fascia and tendon of lower back, initial encounter: Secondary | ICD-10-CM

## 2024-01-17 DIAGNOSIS — K921 Melena: Secondary | ICD-10-CM | POA: Diagnosis not present

## 2024-01-17 MED ORDER — TIZANIDINE HCL 4 MG PO TABS: 4.0000 mg | ORAL_TABLET | Freq: Every evening | ORAL | 0 refills | Status: DC | PRN

## 2024-01-17 MED ORDER — METHYLPREDNISOLONE 4 MG PO TBPK: ORAL_TABLET | ORAL | 0 refills | Status: DC

## 2024-01-17 NOTE — Discharge Instructions (Addendum)
-   Your back condition is a chronic issue so you definitely need to follow-up with orthopedics.  See information below and make a follow-up appointment.  You likely need an MRI since this has been going on for 6 years. - I sent a corticosteroid taper and a muscle relaxer to the pharmacy.  Use a heating pad.  Also use Tylenol .  Use muscle rubs and lidocaine  patches, Salonpas patches.  Stretch regularly. - If any loss of bowel or bladder control, leg weakness or falls go immediately to the ER. - Please go to the [facility] health website and make an appoint with a primary care provider for further workup of the blood in the stool since this is also a chronic issue and requires a comprehensive workup likely to include GI referral, colonoscopy and lab work.  You have a condition requiring you to follow up with Orthopedics so please call one of the following office for appointment:   [facility] Address: [address] [place], [place] [address] Phone: [phone]  [facility] [address] [place], [place] [address] Phone: [phone]  [facility] [address] [place], [place] [address] Phone: [phone]

## 2024-01-17 NOTE — ED Provider Notes (Signed)
 [facility]    CSN: [id number] Arrival date & time: 01/17/24  1844      History   Chief Complaint Chief Complaint  Patient presents with   Back Pain   Hematochezia    HPI Luis Bryant is a 38 y.o. male presenting for chronic back pain. He reports intermittent pain over the past 6 years. He was seen here 8 months ago for back pain and treated for lumbar strain when he began having low back pain while having a bowel movement.   Present symptoms of back pain x 2 days. Pain is all the way across the lower back. No radiation to legs. Worse pain with twisting movements and laying down.  He denies fall/trauma/injury. No swelling, contusions, wounds, numbness/tingling, gait disturbances, bowel/bladder incontinence, or difficulty urinating.   He has tried OTC meds (ibuprofen ) and use of heat.   Patient never followed up with orthopedics for his chronic back pain.  He also complains of intermittent blood in stool over the past year.  Reports noting blood every couple months.  Denies any associated abdominal pain or swelling, black stools.  Blood is not in stool.  He notices on the tissue paper and in the toilet bowl.  Denies any heavy bleeding.  No rectal pain or swelling.  Patient has not seen any hemorrhoids.  He has no personal history of inflammatory bowel disease or other GI problems and denies any family history of such.  He has not seen a primary care provider regarding this and is not established with a PCP.  HPI  History reviewed. No pertinent past medical history.  There are no active problems to display for this patient.   History reviewed. No pertinent surgical history.     Home Medications    Prior to Admission medications   Medication Sig Start Date End Date Taking? Authorizing Provider  methylPREDNISolone  (MEDROL  DOSEPAK) 4 MG TBPK tablet Take p.o. according to Dosepak 01/17/24  Yes Arvis Huxley B, PA-C  tiZANidine  (ZANAFLEX ) 4 MG tablet Take 1  tablet (4 mg total) by mouth at bedtime as needed for up to 10 days. 01/17/24 01/27/24 Yes Arvis Huxley NOVAK, PA-C    Family History Family History  Problem Relation Age of Onset   Prostate cancer Neg Hx    Bladder Cancer Neg Hx    Kidney cancer Neg Hx     Social History Social History   Tobacco Use   Smoking status: Never   Smokeless tobacco: Never  Vaping Use   Vaping status: Never Used  Substance Use Topics   Alcohol use: Yes    Comment: occasionally.    Drug use: No     Allergies   Patient has no known allergies.   Review of Systems Review of Systems  Constitutional:  Negative for appetite change, fatigue and unexpected weight change.  Gastrointestinal:  Positive for blood in stool. Negative for abdominal distention, abdominal pain, anal bleeding, constipation, diarrhea, nausea, rectal pain and vomiting.  Genitourinary:  Negative for difficulty urinating.  Musculoskeletal:  Positive for back pain. Negative for arthralgias, gait problem and joint swelling.  Neurological:  Negative for weakness and numbness.     Physical Exam Triage Vital Signs ED Triage Vitals  Encounter Vitals Group     BP      Girls Systolic BP Percentile      Girls Diastolic BP Percentile      Boys Systolic BP Percentile      Boys Diastolic BP Percentile  Pulse      Resp      Temp      Temp src      SpO2      Weight      Height      Head Circumference      Peak Flow      Pain Score      Pain Loc      Pain Education      Exclude from Growth Chart    No data found.  Updated Vital Signs BP 106/71 (BP Location: Right Arm)   Pulse 91   Temp 98.1 F (36.7 C) (Oral)   Resp 18   Wt 165 lb (74.8 kg)   SpO2 95%   BMI 26.63 kg/m       Physical Exam Vitals and nursing note reviewed.  Constitutional:      General: He is not in acute distress.    Appearance: Normal appearance. He is well-developed. He is not ill-appearing.  HENT:     Head: Normocephalic and atraumatic.   Eyes:     General: No scleral icterus.    Conjunctiva/sclera: Conjunctivae normal.  Cardiovascular:     Rate and Rhythm: Normal rate and regular rhythm.  Pulmonary:     Effort: Pulmonary effort is normal. No respiratory distress.     Breath sounds: Normal breath sounds.  Abdominal:     General: Bowel sounds are normal.     Palpations: Abdomen is soft.     Tenderness: There is no abdominal tenderness. There is no guarding or rebound.  Musculoskeletal:     Cervical back: Neck supple.     Lumbar back: Tenderness (bilateral paralumbar muscles) and bony tenderness (L4-L5, L5-S1) present. Normal range of motion. Negative right straight leg raise test and negative left straight leg raise test.  Skin:    General: Skin is warm and dry.     Capillary Refill: Capillary refill takes less than 2 seconds.  Neurological:     General: No focal deficit present.     Mental Status: He is alert. Mental status is at baseline.     Motor: No weakness.     Gait: Gait normal.  Psychiatric:        Mood and Affect: Mood normal.        Behavior: Behavior normal.      UC Treatments / Results  Labs (all labs ordered are listed, but only abnormal results are displayed) Labs Reviewed - No data to display  EKG   Radiology No results found.  Procedures Procedures (including critical care time)  Medications Ordered in UC Medications - No data to display  Initial Impression / Assessment and Plan / UC Course  I have reviewed the triage vital signs and the nursing notes.  Pertinent labs & imaging results that were available during my care of the patient were reviewed by me and considered in my medical decision making (see chart for details).   38 y/o male presents for chronic back pain for 6 years. Current back pain flare up x 2 days. Seen here 8 months ago and treated with muscle relaxers and medrol  for acute onset of pain same day. Patient has not seen orthopedics or gone to PT for chronic back  pain. Denies numbness/tingling/weakness or loss of bowel/bladder control. Patient also reporting intermittent blood in toilet bowl when having Bms x 1  year. No abdominal pain, weight loss.  Chronic back pain: Sent Medrol  and tizanidine  for acute flareup and encouraged  him to follow-up with orthopedics since this has been a multi year problem.  Reviewed ED precautions were back pain.  Blood in stool: Patient not currently established with a PCP.  Explained to him blood in stool for a year intermittently should be worked up by PCP and may need referral to GI.  Information given on how to establish with primary care provider.  Advised him to return here sooner if he has any acute episodes of blood in stool.  Advised to go to ED for any heavy bleeding episodes or associated abdominal pain, weakness, fever.  Patient is agreeable.   Final Clinical Impressions(s) / UC Diagnoses   Final diagnoses:  Chronic bilateral low back pain without sciatica  Strain of lumbar region, initial encounter  Blood in stool     Discharge Instructions      - Your back condition is a chronic issue so you definitely need to follow-up with orthopedics.  See information below and make a follow-up appointment.  You likely need an MRI since this has been going on for 6 years. - I sent a corticosteroid taper and a muscle relaxer to the pharmacy.  Use a heating pad.  Also use Tylenol .  Use muscle rubs and lidocaine  patches, Salonpas patches.  Stretch regularly. - If any loss of bowel or bladder control, leg weakness or falls go immediately to the ER. - Please go to the [facility] health website and make an appoint with a primary care provider for further workup of the blood in the stool since this is also a chronic issue and requires a comprehensive workup likely to include GI referral, colonoscopy and lab work.  You have a condition requiring you to follow up with Orthopedics so please call one of the following office for  appointment:   [facility] Address: [address], [place], [place] [address] Phone: [phone]  [facility] [address], [place], [place] [address] Phone: [phone]  [facility] [address], [place], [place] [address] Phone: [phone]      ED Prescriptions     Medication Sig Dispense Auth. Provider   methylPREDNISolone  (MEDROL  DOSEPAK) 4 MG TBPK tablet Take p.o. according to Dosepak 21 tablet Arvis Huxley B, PA-C   tiZANidine  (ZANAFLEX ) 4 MG tablet Take 1 tablet (4 mg total) by mouth at bedtime as needed for up to 10 days. 10 tablet Makynzee Tigges B, PA-C      PDMP not reviewed this encounter.   Arvis Huxley NOVAK, PA-C 01/20/24 [phone]

## 2024-01-17 NOTE — ED Triage Notes (Signed)
 Patient staes that he's lower back pain that runs across his back. Patient states that's he hasn't seen any other drs besides us . Patient also states that he had 1 BM that had blood in it. Patient states that he's had off and on blood in his stools for months.

## 2024-01-27 ENCOUNTER — Ambulatory Visit
Admission: EM | Admit: 2024-01-27 | Discharge: 2024-01-27 | Disposition: A | Discharge status: Home / Self Care | Attending: Emergency Medicine | Admitting: Emergency Medicine

## 2024-01-27 ENCOUNTER — Ambulatory Visit (INDEPENDENT_AMBULATORY_CARE_PROVIDER_SITE_OTHER)

## 2024-01-27 ENCOUNTER — Encounter: Payer: Self-pay | Admitting: Emergency Medicine

## 2024-01-27 DIAGNOSIS — S8002XA Contusion of left knee, initial encounter: Secondary | ICD-10-CM

## 2024-01-27 MED ORDER — IBUPROFEN 600 MG PO TABS: 600.0000 mg | ORAL_TABLET | Freq: Four times a day (QID) | ORAL | 0 refills | Status: AC | PRN

## 2024-01-27 NOTE — ED Provider Notes (Signed)
 HPI  SUBJECTIVE:  Luis Bryant is a 38 y.o. male who presents with constant, throbbing left knee pain, bruising, swelling, erythema after a bush hog fell onto his upper knee yesterday.  No distal numbness or tingling.  He states flexion extension is painful, but is not limited.  Family member states that his knee gives way secondary to pain when he walks, patient denies giving way due to weakness.  He is unable to bear weight on it fully.  He has not tried anything for this.  No alleviating factors.  Symptoms are worse with attempting to bear weight, flexion and extension.  He has never injured this knee before.  He is not on antianticoagulants or antiplatelets.  He has no past medical history.  PCP: None.  Orthopedics: None.  Additional history obtained from family member who accompanies him today.    History reviewed. No pertinent past medical history.  History reviewed. No pertinent surgical history.  Family History  Problem Relation Age of Onset   Prostate cancer Neg Hx    Bladder Cancer Neg Hx    Kidney cancer Neg Hx     Social History   Tobacco Use   Smoking status: Never   Smokeless tobacco: Never  Vaping Use   Vaping status: Never Used  Substance Use Topics   Alcohol use: Yes    Comment: occasionally.    Drug use: No    No current facility-administered medications for this encounter.  Current Outpatient Medications:    ibuprofen  (ADVIL ) 600 MG tablet, Take 1 tablet (600 mg total) by mouth every 6 (six) hours as needed for up to 7 days., Disp: 28 tablet, Rfl: 0  No Known Allergies   ROS  As noted in HPI.   Physical Exam  BP 108/71 (BP Location: Right Arm)   Pulse 69   Temp 97.9 F (36.6 C) (Oral)   Resp 15   Ht 5' 6 (1.676 m)   Wt 74.8 kg   SpO2 98%   BMI 26.62 kg/m   Constitutional: Well developed, well nourished, no acute distress Eyes:  EOMI, conjunctiva normal bilaterally HENT: Normocephalic, atraumatic,mucus membranes moist Respiratory:  Normal inspiratory effort Cardiovascular: Normal rate GI: nondistended skin: No rash, skin intact Musculoskeletal: no deformities  L Knee ROM decreased due to pain, Flexion  intact , Tenderness entire joint, Patella tender, Patellar tendon tender, Medial joint tender, Lateral joint tender, Popliteal region tender, Varus MCL stress testing stable, Valgus LCL stress testing stable, ACL/PCL stable, McMurray negative,  Distal NVI with intact baseline sensation / motor / pulse distal to knee.  Questionable small effusion. No erythema. No increased temperature.  Positive crepitus.  Contusion over the patella.  Tenderness over the distal quadriceps.  No appreciable soft tissue defect at the insertion of the quadricep. Neurologic: Alert & oriented x 3, no focal neuro deficits Psychiatric: Speech and behavior appropriate   ED Course   Medications - No data to display  Orders Placed This Encounter  Procedures   DG Knee Complete 4 Views Left    Standing Status:   Standing    Number of Occurrences:   1    Reason for Exam (SYMPTOM  OR DIAGNOSIS REQUIRED):   left knee pain due to injury yesterday   Apply hinged knee brace    Standing Status:   Standing    Number of Occurrences:   1    Laterality:   Left   Crutches    Standing Status:   Standing  Number of Occurrences:   1    No results found for this or any previous visit (from the past 24 hours). DG Knee Complete 4 Views Left Result Date: 01/27/2024 CLINICAL DATA:  left knee pain due to injury yesterday EXAM: LEFT KNEE - COMPLETE 4+ VIEW COMPARISON:  None Available. FINDINGS: No acute fracture or dislocation. Joint spaces and alignment are maintained. No area of erosion or osseous destruction. No unexpected radiopaque foreign body. Soft tissue edema. No joint effusion. IMPRESSION: No acute fracture or dislocation. Electronically Signed   By: Corean Salter M.D.   On: 01/27/2024 12:14    ED Clinical Impression  1. Contusion of left knee,  initial encounter      ED Assessment/Plan     Reviewed imaging independently.  Soft tissue edema.  No effusion.  No acute fracture or dislocation per radiology.  See radiology report for full details.  X-rays negative for fracture, dislocation or joint effusion.  Concern for soft tissue injury, perhaps quadriceps contusion versus tear, but the knee is stable.  Has a contusion of the patella.  Will place in knee brace, crutches, send home with Tylenol /ibuprofen .  Advised ice for 15 minutes at a time, follow-up with [facility] ASAP.  He declined pain medications here and a work note.  Discussed labs,, MDM, treatment plan, and plan for follow-up with patient and family member. Discussed sn/sx that should prompt return to the ED. they agree with plan.   Meds ordered this encounter  Medications   ibuprofen  (ADVIL ) 600 MG tablet    Sig: Take 1 tablet (600 mg total) by mouth every 6 (six) hours as needed for up to 7 days.    Dispense:  28 tablet    Refill:  0      *This clinic note was created using Scientist, clinical (histocompatibility and immunogenetics). Therefore, there may be occasional mistakes despite careful proofreading.  ?    Van Knee, MD 01/27/24 1226

## 2024-01-27 NOTE — Discharge Instructions (Signed)
 Use the crutches and wear the knee brace for comfort.  Take 600 mg of ibuprofen  combined with 1000 mg of Tylenol  3-4 times a day as needed for pain.  Ice your knee for 15 minutes at a time.  Radiology did not see a fracture, dislocation or fluid in your knee.  Please follow-up with [facility] in several days to make sure that you are getting better and that there is no additional damage to your knee.

## 2024-01-27 NOTE — ED Triage Notes (Signed)
 Patient states that a 800lb tractor equipment fell and landed on his left knee yesterday.
